# Patient Record
Sex: Male | Born: 1945 | Hispanic: Yes | Marital: Married | Smoking: Never smoker
Health system: Southern US, Community
[De-identification: ages and names within clinical notes are randomized; demographics above are authoritative.]

## PROBLEM LIST (undated history)

## (undated) DIAGNOSIS — E079 Disorder of thyroid, unspecified: Secondary | ICD-10-CM

## (undated) DIAGNOSIS — J45909 Unspecified asthma, uncomplicated: Secondary | ICD-10-CM

## (undated) DIAGNOSIS — R7303 Prediabetes: Secondary | ICD-10-CM

## (undated) DIAGNOSIS — I10 Essential (primary) hypertension: Secondary | ICD-10-CM

## (undated) DIAGNOSIS — E291 Testicular hypofunction: Secondary | ICD-10-CM

## (undated) DIAGNOSIS — M1990 Unspecified osteoarthritis, unspecified site: Secondary | ICD-10-CM

## (undated) DIAGNOSIS — R0600 Dyspnea, unspecified: Secondary | ICD-10-CM

## (undated) DIAGNOSIS — T7840XA Allergy, unspecified, initial encounter: Secondary | ICD-10-CM

## (undated) DIAGNOSIS — I8390 Asymptomatic varicose veins of unspecified lower extremity: Secondary | ICD-10-CM

## (undated) HISTORY — DX: Asymptomatic varicose veins of unspecified lower extremity: I83.90

## (undated) HISTORY — DX: Unspecified osteoarthritis, unspecified site: M19.90

## (undated) HISTORY — DX: Disorder of thyroid, unspecified: E07.9

## (undated) HISTORY — DX: Prediabetes: R73.03

## (undated) HISTORY — DX: Testicular hypofunction: E29.1

## (undated) HISTORY — DX: Allergy, unspecified, initial encounter: T78.40XA

## (undated) HISTORY — DX: Unspecified asthma, uncomplicated: J45.909

## (undated) HISTORY — DX: Essential (primary) hypertension: I10

## (undated) HISTORY — DX: Dyspnea, unspecified: R06.00

## (undated) HISTORY — PX: VARICOSE VEIN SURGERY: SHX832

---

## 2007-07-15 ENCOUNTER — Encounter: Payer: Self-pay | Admitting: Internal Medicine

## 2007-07-21 ENCOUNTER — Ambulatory Visit: Payer: Self-pay | Admitting: Internal Medicine

## 2007-07-21 DIAGNOSIS — E039 Hypothyroidism, unspecified: Secondary | ICD-10-CM

## 2007-07-21 DIAGNOSIS — N509 Disorder of male genital organs, unspecified: Secondary | ICD-10-CM | POA: Insufficient documentation

## 2007-07-21 DIAGNOSIS — E291 Testicular hypofunction: Secondary | ICD-10-CM

## 2007-07-21 DIAGNOSIS — E785 Hyperlipidemia, unspecified: Secondary | ICD-10-CM | POA: Insufficient documentation

## 2007-07-21 DIAGNOSIS — D126 Benign neoplasm of colon, unspecified: Secondary | ICD-10-CM

## 2007-07-21 DIAGNOSIS — I861 Scrotal varices: Secondary | ICD-10-CM

## 2007-07-21 LAB — CONVERTED CEMR LAB
Bilirubin Urine: NEGATIVE
Ketones, urine, test strip: NEGATIVE
Nitrite: NEGATIVE

## 2007-08-02 ENCOUNTER — Encounter: Admission: RE | Admit: 2007-08-02 | Discharge: 2007-08-02 | Payer: Self-pay | Admitting: Internal Medicine

## 2007-11-14 ENCOUNTER — Ambulatory Visit: Payer: Self-pay | Admitting: Internal Medicine

## 2007-11-14 DIAGNOSIS — J45909 Unspecified asthma, uncomplicated: Secondary | ICD-10-CM | POA: Insufficient documentation

## 2007-11-14 DIAGNOSIS — M159 Polyosteoarthritis, unspecified: Secondary | ICD-10-CM | POA: Insufficient documentation

## 2007-11-14 DIAGNOSIS — M1990 Unspecified osteoarthritis, unspecified site: Secondary | ICD-10-CM

## 2007-11-23 ENCOUNTER — Telehealth: Payer: Self-pay | Admitting: Internal Medicine

## 2007-11-23 LAB — CONVERTED CEMR LAB
ALT: 36 units/L (ref 0–53)
BUN: 16 mg/dL (ref 6–23)
Basophils Relative: 0.6 % (ref 0.0–1.0)
CO2: 30 meq/L (ref 19–32)
Calcium: 9.3 mg/dL (ref 8.4–10.5)
Chloride: 105 meq/L (ref 96–112)
Cholesterol: 200 mg/dL (ref 0–200)
Creatinine, Ser: 0.7 mg/dL (ref 0.4–1.5)
Eosinophils Relative: 14.8 % — ABNORMAL HIGH (ref 0.0–5.0)
GFR calc non Af Amer: 122 mL/min
Glucose, Bld: 98 mg/dL (ref 70–99)
HCT: 45.4 % (ref 39.0–52.0)
Hemoglobin: 15.2 g/dL (ref 13.0–17.0)
Lymphocytes Relative: 51.3 % — ABNORMAL HIGH (ref 12.0–46.0)
MCV: 91.7 fL (ref 78.0–100.0)
Monocytes Absolute: 0.5 10*3/uL (ref 0.1–1.0)
Neutro Abs: 0.8 10*3/uL — ABNORMAL LOW (ref 1.4–7.7)
PSA: 2.32 ng/mL (ref 0.10–4.00)
Rhuematoid fact SerPl-aCnc: <20 intl units/mL — ABNORMAL LOW (ref 0.0–20.0)
Sed Rate: 5 mm/hr (ref 0–16)
Testosterone: 188.28 ng/dL — ABNORMAL LOW (ref 350.00–890)

## 2007-11-24 ENCOUNTER — Telehealth (INDEPENDENT_AMBULATORY_CARE_PROVIDER_SITE_OTHER): Payer: Self-pay | Admitting: *Deleted

## 2007-12-26 ENCOUNTER — Telehealth (INDEPENDENT_AMBULATORY_CARE_PROVIDER_SITE_OTHER): Payer: Self-pay | Admitting: *Deleted

## 2008-09-09 IMAGING — US US SCROTUM
1 series · 13 of 25 positions shown · non-contrast
Comparison: none

CLINICAL DATA: Pain.  
 SCROTAL ULTRASOUND:
 DOPPLER ULTRASOUND OF THE TESTICLES:
TECHNIQUE: Complete ultrasound examination of the testicles, epididymis, and other scrotal structures was performed.  Color and spectral Doppler ultrasound were also utilized to evaluate blood flow to the testicles.

[Series 1: us scrotum · 0.09mm/px · 13 of 71 slices shown]
[im 1/71]
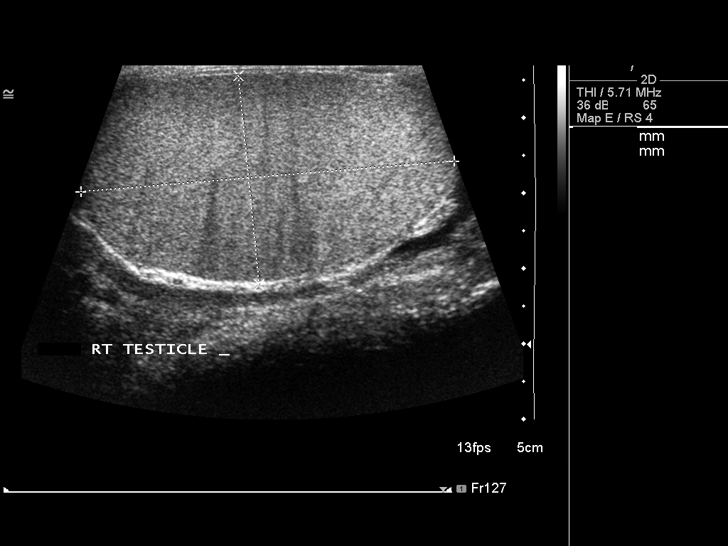
[im 6/71]
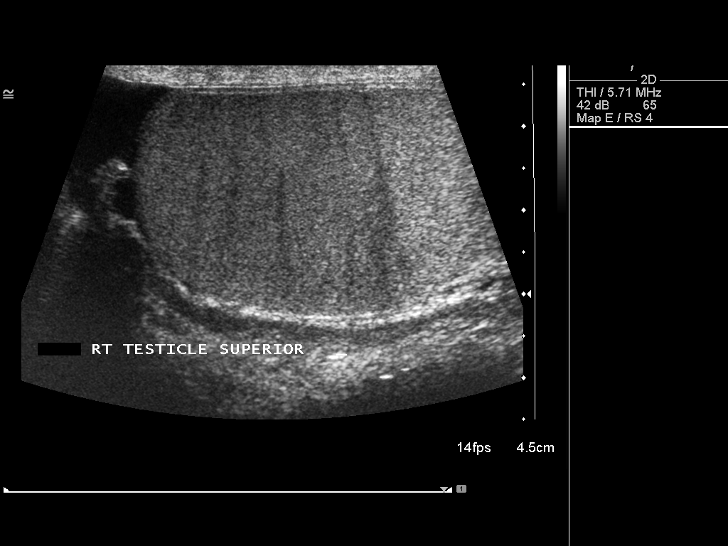
[im 12/71]
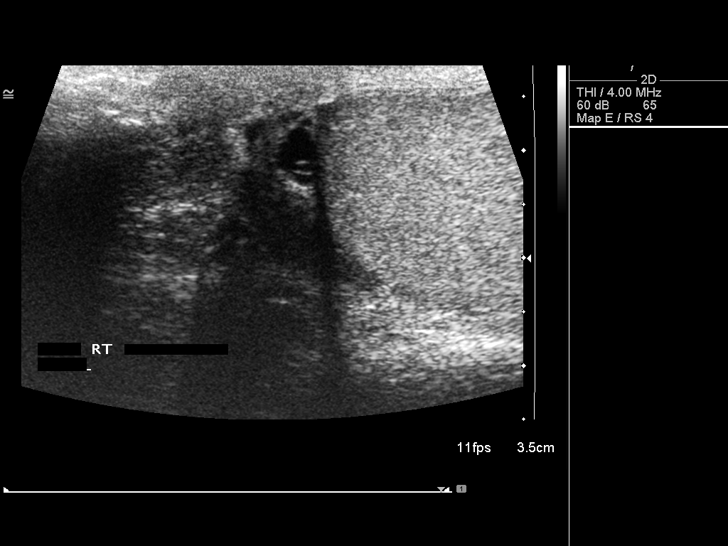
[im 18/71]
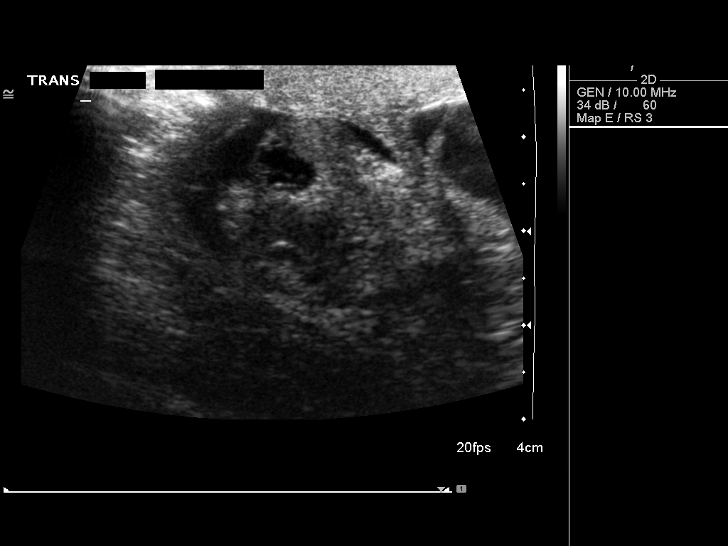
[im 24/71]
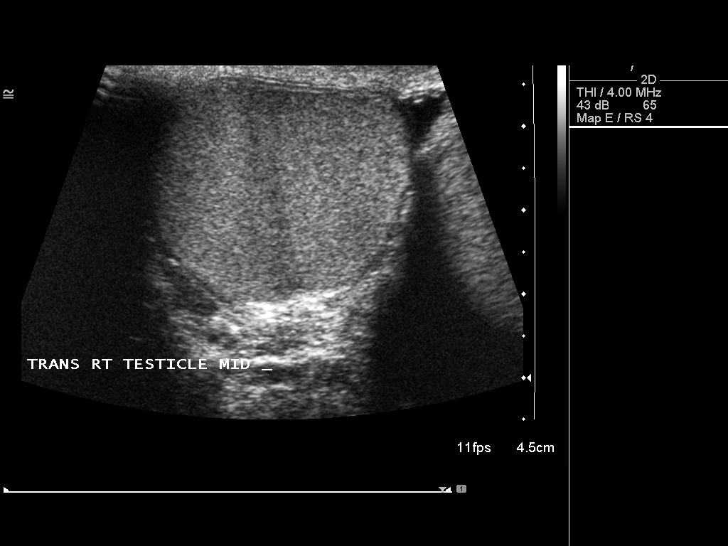
[im 30/71]
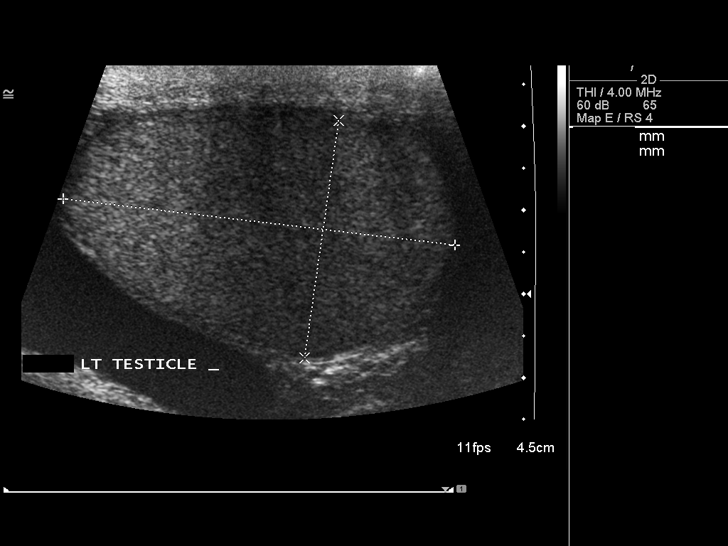
[im 36/71]
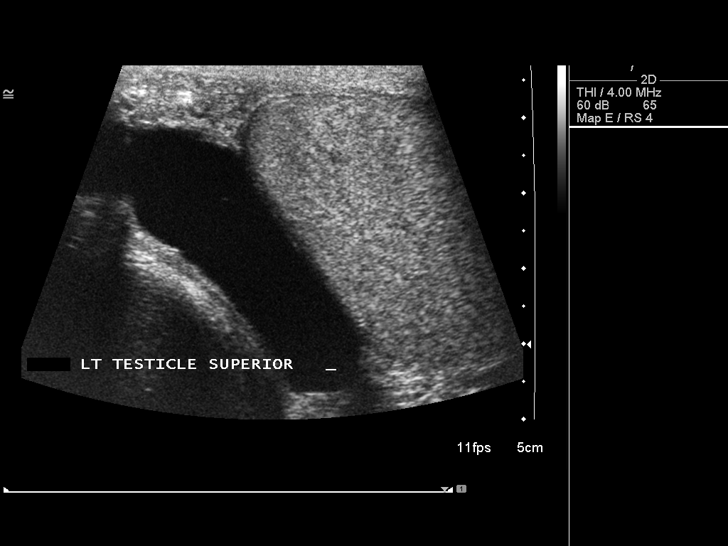
[im 41/71]
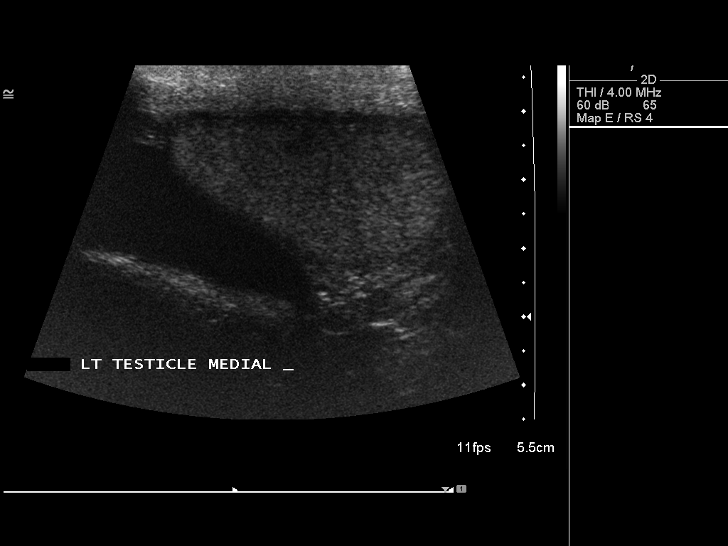
[im 47/71]
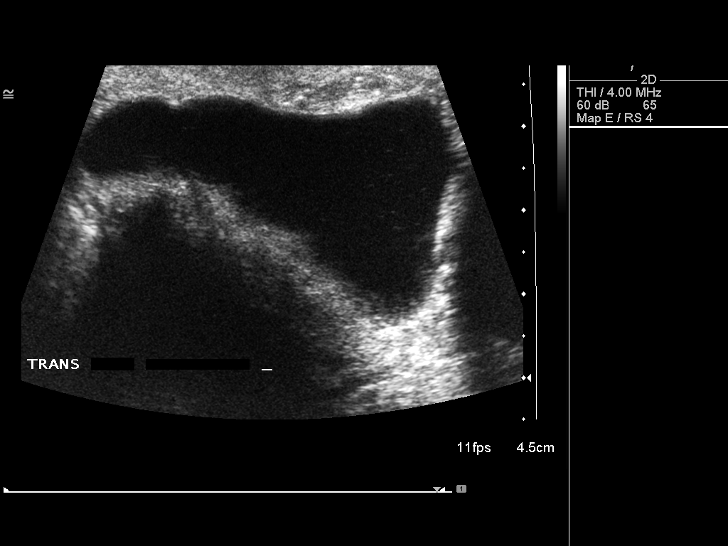
[im 53/71]
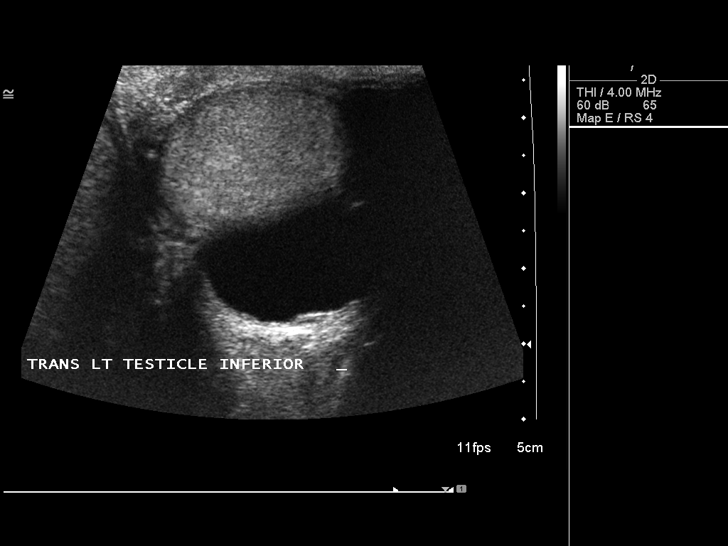
[im 59/71]
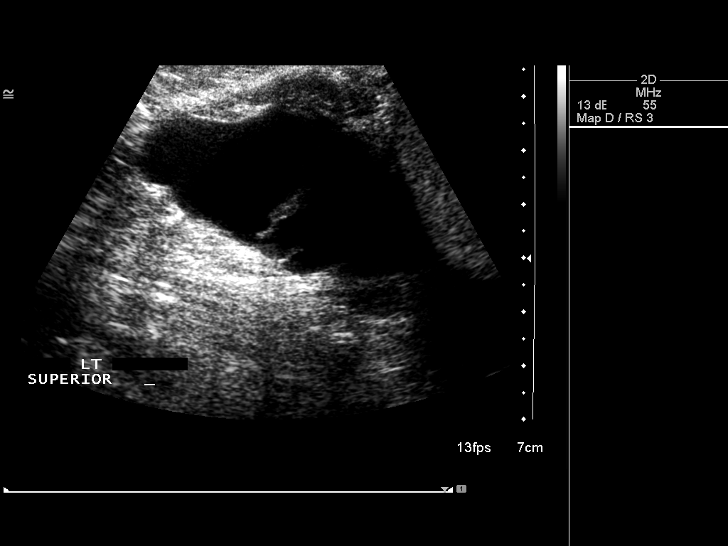
[im 65/71]
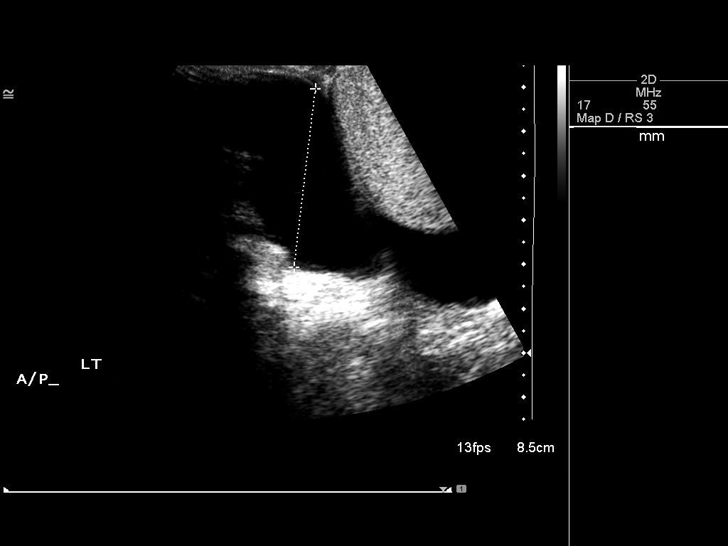
[im 71/71]
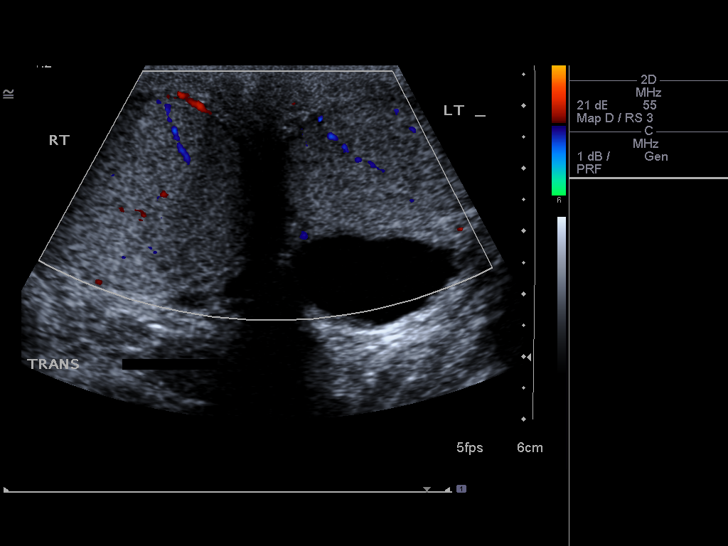

[13 of 25 positions shown; findings below may reference images not displayed]

FINDINGS: The right testicle is 5.0 x 2.8 x 3.1 cm and the left testicle is 4.7 x 2.9 x 3.6 cm.  Both testicles are homogeneous in echotexture, without evidence of focal mass.  Color Doppler flow to the testicles is symmetric.  Arterial and venous wave forms are identified within both testicles, without evidence of torsion.  
 There is a large right hydrocele that contains several echogenic bands coursing through it, suggesting mild complexity of the hydrocele.  There is a very small right hydrocele.  A 6 mm cyst is identified in the right epididymis, likely spermatocele or epididymal cyst.  The left epididymis is somewhat difficult to visualize due to the large hydrocele but does not appear to be enlarged.
IMPRESSION: 1.  Large mildly complex left hydrocele.  The presence of the septations in this hydrocele suggests that it is likely chronic. 
 2.  Normal appearance of the testicles.

## 2010-03-04 ENCOUNTER — Encounter: Admission: RE | Admit: 2010-03-04 | Discharge: 2010-03-04 | Payer: Self-pay | Admitting: Specialist

## 2011-10-05 ENCOUNTER — Other Ambulatory Visit: Payer: Self-pay

## 2011-10-05 DIAGNOSIS — I83893 Varicose veins of bilateral lower extremities with other complications: Secondary | ICD-10-CM

## 2011-11-15 ENCOUNTER — Encounter: Payer: Self-pay | Admitting: Surgery

## 2011-11-19 ENCOUNTER — Encounter: Payer: Self-pay | Admitting: Surgery

## 2011-11-22 ENCOUNTER — Encounter: Payer: Self-pay | Admitting: Surgery

## 2011-12-16 ENCOUNTER — Encounter: Payer: Self-pay | Admitting: Vascular Surgery

## 2011-12-17 ENCOUNTER — Encounter: Payer: Self-pay | Admitting: Vascular Surgery

## 2012-02-04 ENCOUNTER — Encounter: Payer: Self-pay | Admitting: Surgery

## 2012-02-07 ENCOUNTER — Encounter (INDEPENDENT_AMBULATORY_CARE_PROVIDER_SITE_OTHER): Payer: BC Managed Care – PPO | Admitting: *Deleted

## 2012-02-07 ENCOUNTER — Encounter: Payer: Self-pay | Admitting: Surgery

## 2012-02-07 ENCOUNTER — Ambulatory Visit (INDEPENDENT_AMBULATORY_CARE_PROVIDER_SITE_OTHER): Payer: BC Managed Care – PPO | Admitting: Surgery

## 2012-02-07 VITALS — BP 136/75 | HR 73 | Temp 98.2°F | Ht 65.0 in | Wt 184.0 lb

## 2012-02-07 DIAGNOSIS — I8310 Varicose veins of unspecified lower extremity with inflammation: Secondary | ICD-10-CM

## 2012-02-07 DIAGNOSIS — I83893 Varicose veins of bilateral lower extremities with other complications: Secondary | ICD-10-CM

## 2012-02-07 NOTE — Progress Notes (Signed)
[facility]   Patient name: Clarence Sanders MRN: [id number] DOB: 1946/03/24 Sex: male   Referred by: Self  Reason for referral:  Chief Complaint  Patient presents with  . Varicose Veins    self referred - pt c/o of pain in right leg    HISTORY OF PRESENT ILLNESS: The patient comes in today for further evaluation of right lower extremity varicosities. He states that approximately 25 years ago he underwent a vein operation in [place] to treat varicosities on his left leg. He has had no issues since his operation. Approximately 8 years ago he had surgery on the right leg for similar problems however he has had persistent issues in his right leg. He continues to have significant pain in the varicose veins particularly at the end of a long day were he has been on his feet for a long time. He does not complain of a significant amount of swelling although he will get swelling at the end of the day which is improved by wearing knee-high compression stockings.  Past Medical History  Diagnosis Date  . Hypertension   . Varicose veins   . Thyroid disease     Hypothyroidism  . Hypogonadism male     Past Surgical History  Procedure Date  . Varicose vein surgery     removed vein in left leg    History   Social History  . Marital Status: Married    Spouse Name: N/A    Number of Children: N/A  . Years of Education: N/A   Occupational History  . Not on file.   Social History Main Topics  . Smoking status: Never Smoker   . Smokeless tobacco: Never Used  . Alcohol Use: 5.4 oz/week    5 Cans of beer, 4 Shots of liquor per week  . Drug Use: No  . Sexually Active: Not on file   Other Topics Concern  . Not on file   Social History Narrative  . No narrative on file    Family History  Problem Relation Age of Onset  . Other Mother     varicose veins  . Heart disease Father     before age 71  . Heart attack Father   . Cancer Sister   . Cancer Brother       Allergies as of 02/07/2012 - Review Complete 02/07/2012  Allergen Reaction Noted  . Aspirin  07/21/2007  . Ketorolac tromethamine  07/21/2007  . Penicillins  07/21/2007    Current Outpatient Prescriptions on File Prior to Visit  Medication Sig Dispense Refill  . AZOR 5-40 MG per tablet Take 5 tablets by mouth daily.      . Thyroid, Pork, 60 MG CAPS Take 60 mg by mouth daily.         REVIEW OF SYSTEMS: Positive for pain in the varicose veins all other systems are negative as documented by the patient and the encounter form.  PHYSICAL EXAMINATION: General: The patient appears their stated age.  Vital signs are BP 136/75  Pulse 73  Temp 98.2 F (36.8 C) (Oral)  Ht 5\' 5"  (1.651 m)  Wt 184 lb (83.462 kg)  BMI 30.62 kg/m2 HEENT:  No gross abnormalities Pulmonary: Respirations are non-labored Musculoskeletal: There are no major deformities.   Neurologic: No focal weakness or paresthesias are detected, Skin: There are no ulcer or rashes noted. Psychiatric: The patient has normal affect. Cardiovascular: There is a regular rate and rhythm without significant murmur appreciated.  No carotid bruit. Palpable pedal pulses. Prominent varicosities on the right lateral leg and upper thigh. Trace edema  Diagnostic Studies: Duplex ultrasound was ordered and reviewed. The proximal right saphenous vein has previously been stripped. There is residual saphenous vein present which is positive for reflux. He also has severe reflux in the deep system. The saphenous vein diameter is 0.6 to a 0.4    Assessment:  Right leg reflux with varicosities Plan: The patient has both superficial and deep system reflux in his right leg which are contributing to his symptoms. In addition he has a very prominent varicosity along the anterior and lateral side of his leg. This does become very painful for him with prolonged standing. I believe he would benefit from ablation of the residual greater saphenous vein  which continues to have reflux as well as phlebectomy of his prominent varicosity. He does have deep system reflux over this may not completely alleviate his problems but I do believe it will improve his symptoms. I have placed him in thigh high 20-30 mm compression stockings to be worn beginning today. He will followup with Dr. Hart Rochester in 3 months to discuss possible intervention. He is Spanish speaking and an interpreter was present with him today.     Jorge Ny, M.D. [facility] [place] Office: [phone] Pager:  [phone]

## 2012-02-14 NOTE — Procedures (Unsigned)
LOWER EXTREMITY VENOUS REFLUX EXAM  INDICATION:  Right lower extremity varicose veins with pain and swelling.  Previous right great saphenous vein stripping.  EXAM:  Using color-flow imaging and pulse Doppler spectral analysis, the right common femoral, femoral, popliteal, posterior tibial, great and small saphenous veins were evaluated.  There is evidence suggesting deep venous insufficiency in the right lower extremity.  The right saphenofemoral junction and proximal great saphenous vein were not visualized due to previous stripping.  In the mid thigh the great saphenous vein reconstitutes and is not competent with reflux of >500 milliseconds.  The right anterior saphenous vein is not competent with reflux of >500 milliseconds.  Diameter measurements range from 0.73 cm to 0.49 cm.  The right proximal small saphenous vein demonstrates competency.  GSV Diameter (used if found to be incompetent only)                                           Right    Left Proximal Greater Saphenous Vein           NV cm    cm Proximal-to-mid-thigh                     NV cm    cm Mid thigh                                 0.47 cm  cm Mid-distal thigh                          cm       cm Distal thigh                              0.64 cm  cm Knee                                      0.52 cm  cm  IMPRESSION: 1. Previous stripping of right great saphenous vein, however, there is     reconstitution in the mid thigh and the remainder of the vein is     incompetent with reflux of >500 milliseconds. 2. The right anterior saphenous vein is not competent with reflux of     >500 milliseconds. 3. The deep venous system is not competent with reflux of >500     milliseconds. 4. The small saphenous vein is competent.       ___________________________________________ V. Charlena Cross, MD  LT/MEDQ  D:  02/08/2012  T:  02/08/2012  Job:  [id number]

## 2012-05-08 ENCOUNTER — Encounter: Payer: Self-pay | Admitting: Vascular Surgery

## 2012-05-09 ENCOUNTER — Ambulatory Visit (INDEPENDENT_AMBULATORY_CARE_PROVIDER_SITE_OTHER): Payer: Medicare Other | Admitting: Vascular Surgery

## 2012-05-09 ENCOUNTER — Encounter: Payer: Self-pay | Admitting: Vascular Surgery

## 2012-05-09 VITALS — BP 119/73 | HR 69 | Resp 14 | Ht 62.0 in | Wt 176.9 lb

## 2012-05-09 DIAGNOSIS — I83893 Varicose veins of bilateral lower extremities with other complications: Secondary | ICD-10-CM

## 2012-05-09 NOTE — Progress Notes (Addendum)
Subjective:     Patient ID: Clarence Sanders, male   DOB: [birth year], 66 y.o.   MRN: [id number]  HPI this 66 year old male returns for further discussion regarding her severe venous insufficiency of the right leg. He is Hispanic and there is an interpreter present. He has been wearing long leg elastic compression stockings and trying elevation and ibuprofen for the past 3 months without success. He continues to have aching throbbing and burning discomfort beginning in the anterior thigh extending into the lateral calf and medial calf areas. This worsens as the day progresses. He has no history of DVT or thrombophlebitis or stasis ulcers. He had a partial stripping of the great saphenous vein proximally performed 8 years ago in [place]. He also had remote stripping of the left great saphenous vein 25 years ago. He developed recurrence of the right varicosities very rapidly.  Past Medical History  Diagnosis Date  . Hypertension   . Varicose veins   . Thyroid disease     Hypothyroidism  . Hypogonadism male     History  Substance Use Topics  . Smoking status: Never Smoker   . Smokeless tobacco: Never Used  . Alcohol Use: 5.4 oz/week    5 Cans of beer, 4 Shots of liquor per week    Family History  Problem Relation Age of Onset  . Other Mother     varicose veins  . Heart disease Father     before age 42  . Heart attack Father   . Cancer Sister   . Cancer Brother     Allergies  Allergen Reactions  . Aspirin Anaphylaxis    Angioedema,   . Penicillins Anaphylaxis  . Ketorolac Tromethamine     Current outpatient prescriptions:AZOR 5-40 MG per tablet, Take 1 tablet by mouth daily. , Disp: , Rfl: ;  Thyroid, Pork, 60 MG CAPS, Take 60 mg by mouth daily., Disp: , Rfl:   BP 119/73  Pulse 69  Resp 14  Ht 5\' 2"  (1.575 m)  Wt 176 lb 14.4 oz (80.241 kg)  BMI 32.36 kg/m2  SpO2 98%  Body mass index is 32.36 kg/(m^2).         Review of Systems denies chest pain, dyspnea on exertion,  PND, orthopnea     Objective:   Physical Exam blood pressure 119/73 heart rate 69 respirations 14 General well-developed well-nourished male in no apparent stress alert and oriented x3 Lungs no rhonchi or wheezing Right lower extremity with 3+ femoral and dorsalis pedis pulse palpable. There bulging varicosities beginning in the proximal third of the right thigh extending anteriorly and lateral to the knee. There is a second large cluster of varicosities beginning in the distal thigh medially extending into the medial calf. There is 1+ edema distally. No evidence of ischemia or ulceration.  Duplex scan performed at his last visit 02/07/2012 reveals gross reflux in the anterior accessory branch of the right great saphenous vein supplying the anterior varicosities. There is also gross reflux in the right great saphenous vein extending from the knee to the proximal thigh where the vein was not removed. There is no DVT.     Assessment:     Severe venous insufficiency right leg-recurrent with gross reflux #1 anterior accessory branch right great saphenous vein and #2 residual main trunk right great saphenous vein from knee to proximal thigh with bulging symptomatic varicosities-affecting patient's daily living and resistant to medical management     Plan:     Patient needs a laser  ablation of both anterior accessory branch and residual main trunk right great saphenous vein is as single procedure. He will then return in 3 months to see if stab phlebectomy of secondary varicosities will be indicated. Will proceed with precertification to perform this in the near future.     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 He states his symptoms are much worse while he is standing and it is making it difficult for him to complete his work shift. He also travels significant times in an automobile  to and from work and the pain also is worsened by  sitting for long periods. For these reasons his condition is making it difficult for him to carryout his daily activities.

## 2012-08-11 ENCOUNTER — Encounter: Payer: Self-pay | Admitting: Vascular Surgery

## 2012-08-14 ENCOUNTER — Ambulatory Visit (INDEPENDENT_AMBULATORY_CARE_PROVIDER_SITE_OTHER): Payer: BC Managed Care – PPO | Admitting: Vascular Surgery

## 2012-08-14 ENCOUNTER — Encounter: Payer: Self-pay | Admitting: Vascular Surgery

## 2012-08-14 VITALS — BP 131/82 | HR 91 | Resp 16 | Ht 62.0 in | Wt 176.0 lb

## 2012-08-14 DIAGNOSIS — I83893 Varicose veins of bilateral lower extremities with other complications: Secondary | ICD-10-CM

## 2012-08-14 NOTE — Progress Notes (Signed)
Laser Ablation Procedure      Date: 08/14/2012    Clarence Sanders DOB:April 17, 1946  Consent signed: Yes  Surgeon:J.D. Hart Rochester  Procedure: Laser Ablation: right Greater Saphenous Vein  BP 131/82  Pulse 91  Resp 16  Ht 5\' 2"  (1.575 m)  Wt 176 lb (79.833 kg)  BMI 32.19 kg/m2  Start time: 9:10   End time: 9:55  Tumescent Anesthesia: 210 cc 0.9% NaCl with 50 cc Lidocaine HCL with 1% Epi and 15 cc 8.4% NaHCO3  Local Anesthesia: 5 cc Lidocaine HCL and NaHCO3 (ratio 2:1)  Pulsed mode: Watts 15 Seconds 1 Pulses:1 Total Pulses:71 Total Energy: 1065 Total Time: 1:11    Patient tolerated procedure well: Yes  Notes:   Description of Procedure:  After marking the course of the saphenous vein and the secondary varicosities in the standing position, the patient was placed on the operating table in the supine position, and the right leg was prepped and draped in sterile fashion. Local anesthetic was administered, and under ultrasound guidance the saphenous vein was accessed with a micro needle and guide wire; then the micro puncture sheath was placed. A guide wire was inserted to the saphenofemoral junction, followed by a 5 french sheath.  The position of the sheath and then the laser fiber below the junction was confirmed using the ultrasound and visualization of the aiming beam.  Tumescent anesthesia was administered along the course of the saphenous vein using ultrasound guidance. Protective laser glasses were placed on the patient, and the laser was fired at 15 watt pulsed mode advancing 1-2 mm per sec.  For a total of 1065 joules.  A steri strip was applied to the puncture site.    ABD pads and thigh high compression stockings were applied.  Ace wrap bandages were applied over the phlebectomy sites and at the top of the saphenofemoral junction.  Blood loss was less than 15 cc.  The patient ambulated out of the operating room having tolerated the procedure well.

## 2012-08-14 NOTE — Progress Notes (Signed)
Subjective:     Patient ID: Clarence Sanders, male   DOB: [birth year], 67 y.o.   MRN: [id number]  HPI this 67 year old Hispanic male had laser ablation of the right great saphenous vein from the proximal calf to the proximal third of the thigh for venous hypertension with recurrent varicosities. This was done under local tumescent anesthesia and a total of 1065 J of energy was utilized. Patient had previously had stripping of the very proximal portion of the GSV  many years ago. He has gross reflux in the anterior accessory branch as well supplying recurrent varicosities.   Review of Systems     Objective:   Physical ExamBP 131/82  Pulse 91  Resp 16  Ht 5\' 2"  (1.575 m)  Wt 176 lb (79.833 kg)  BMI 32.19 kg/m2       Assessment:    well-tolerated laser ablation right great saphenous vein from the proximal calf to proximal third of thigh-performed under local tumescent anesthesia    Plan:     Return Tuesday, January 14 for venous duplex exam to confirm closure right GSV. Patient will then return in 3 months for further followup. Patient will ultimately require laser ablation anterior accessory branch right GSC plus stab phlebectomy

## 2012-08-15 ENCOUNTER — Encounter: Payer: Self-pay | Admitting: Vascular Surgery

## 2012-08-18 ENCOUNTER — Other Ambulatory Visit: Payer: Self-pay | Admitting: *Deleted

## 2012-08-18 DIAGNOSIS — I83893 Varicose veins of bilateral lower extremities with other complications: Secondary | ICD-10-CM

## 2012-08-21 ENCOUNTER — Encounter: Payer: Self-pay | Admitting: Vascular Surgery

## 2012-08-22 ENCOUNTER — Encounter (INDEPENDENT_AMBULATORY_CARE_PROVIDER_SITE_OTHER): Payer: Medicare Other | Admitting: *Deleted

## 2012-08-22 ENCOUNTER — Ambulatory Visit (INDEPENDENT_AMBULATORY_CARE_PROVIDER_SITE_OTHER): Payer: Medicare Other | Admitting: Vascular Surgery

## 2012-08-22 ENCOUNTER — Encounter: Payer: Self-pay | Admitting: Vascular Surgery

## 2012-08-22 VITALS — BP 112/70 | HR 73 | Resp 18 | Ht 63.0 in | Wt 180.0 lb

## 2012-08-22 DIAGNOSIS — Z48812 Encounter for surgical aftercare following surgery on the circulatory system: Secondary | ICD-10-CM

## 2012-08-22 DIAGNOSIS — I83893 Varicose veins of bilateral lower extremities with other complications: Secondary | ICD-10-CM

## 2012-08-22 NOTE — Progress Notes (Signed)
Subjective:     Patient ID: Clarence Sanders, male   DOB: Jun 07, 1946, 67 y.o.   MRN: [id number]  HPI this 67 year old male is one week post laser ablation right great saphenous vein from mid calf to proximal thigh for bulging varicosities and venous hypertension with tiny. He tolerated the procedure well last week. He has had mild to moderate discomfort along the course of the great saphenous vein. The most proximal portion of the great saphenous vein had been stripped in the past but there was severe reflux in the residual segment. Patient has been wearing his long-leg elastic compression stocking and taking ibuprofen as prescribed.  Past Medical History  Diagnosis Date  . Hypertension   . Varicose veins   . Thyroid disease     Hypothyroidism  . Hypogonadism male     History  Substance Use Topics  . Smoking status: Never Smoker   . Smokeless tobacco: Never Used  . Alcohol Use: 5.4 oz/week    5 Cans of beer, 4 Shots of liquor per week    Family History  Problem Relation Age of Onset  . Other Mother     varicose veins  . Heart disease Father     before age 67  . Heart attack Father   . Cancer Sister   . Cancer Brother     Allergies  Allergen Reactions  . Aspirin Anaphylaxis    Angioedema,   . Penicillins Anaphylaxis  . Ketorolac Tromethamine     Current outpatient prescriptions:AZOR 5-40 MG per tablet, Take 1 tablet by mouth daily. , Disp: , Rfl: ;  Thyroid, Pork, 60 MG CAPS, Take 60 mg by mouth daily., Disp: , Rfl:   BP 112/70  Pulse 73  Resp 18  Ht 5\' 3"  (1.6 m)  Wt 180 lb (81.647 kg)  BMI 31.89 kg/m2  Body mass index is 31.89 kg/(m^2).           Review of Systems denies chest pain, dyspnea on exertion, PND, orthopnea, hemoptysis, claudication to     Objective:   Physical Exam blood pressure 112/70 heart rate 73 respirations 18 General well-developed well-nourished male in no apparent distress alert and oriented x3 Lungs no rhonchi or wheezing Right leg  with mild-to-moderate tenderness along course of great saphenous vein from mid calf to proximal thigh. There are residual bulging varicosities in the anterior thigh from the anterior accessory branch of the great saphenous vein which has known gross reflux area there are also bulging varicosities below the knee medially and laterally.  Today I ordered a venous duplex exam of the right leg which are reviewed and interpreted. The great saphenous vein is totally closed with no DVT.    Assessment:     Severe venous insufficiency right leg with successful closure main trunk right great saphenous vein causing painful varicosities #2 patent large anterior accessory branch right great saphenous vein supplying bulging varicosities anterior thigh and below the knee with gross reflux    Plan:     Continue long-leg elastic compression stockings 20-30 mm gradient #2 return in 3 months. We'll reassess then to see if he needs a laser ablation anterior accessory branch left great saphenous vein possibly to be followed by stab phlebectomy of painful varicosities

## 2012-08-28 ENCOUNTER — Ambulatory Visit: Payer: Medicare Other | Admitting: Vascular Surgery

## 2012-11-20 ENCOUNTER — Encounter: Payer: Self-pay | Admitting: Vascular Surgery

## 2012-11-21 ENCOUNTER — Encounter: Payer: Self-pay | Admitting: Vascular Surgery

## 2012-11-21 ENCOUNTER — Ambulatory Visit (INDEPENDENT_AMBULATORY_CARE_PROVIDER_SITE_OTHER): Payer: BC Managed Care – PPO | Admitting: Vascular Surgery

## 2012-11-21 VITALS — BP 124/79 | HR 73 | Resp 16 | Ht 64.0 in | Wt 180.0 lb

## 2012-11-21 DIAGNOSIS — I83893 Varicose veins of bilateral lower extremities with other complications: Secondary | ICD-10-CM

## 2012-11-21 NOTE — Progress Notes (Signed)
Subjective:     Patient ID: Clarence Sanders, male   DOB: September 15, 1945, 67 y.o.   MRN: [id number]  HPI this 67 year old male returns for continued followup regarding his painful varicose veins in the right anterior lateral thigh. He previously underwent laser ablation of the right great saphenous vein 3 months ago. At that time it was known that he also had severe reflux in the anterior accessory branch supplying bulging varicosities in the anterior thigh. He was only approved for laser ablation of the right great saphenous vein but not the anterior accessory branch. Since his procedure he has continued to have aching throbbing and painful varicosities in the anterior thigh. This is despite wearing long leg elastic compression stockings 20-30 mm gradient and trying elevation and ibuprofen. He also has 1+ edema distally in the ankle. His symptoms worsen as the day progresses and more he is on his feet.  Past Medical History  Diagnosis Date  . Hypertension   . Varicose veins   . Thyroid disease     Hypothyroidism  . Hypogonadism male     History  Substance Use Topics  . Smoking status: Never Smoker   . Smokeless tobacco: Never Used  . Alcohol Use: 5.4 oz/week    5 Cans of beer, 4 Shots of liquor per week    Family History  Problem Relation Age of Onset  . Other Mother     varicose veins  . Heart disease Father     before age 17  . Heart attack Father   . Cancer Sister   . Cancer Brother     Allergies  Allergen Reactions  . Aspirin Anaphylaxis    Angioedema,   . Penicillins Anaphylaxis  . Ketorolac Tromethamine     Current outpatient prescriptions:AZOR 5-40 MG per tablet, Take 1 tablet by mouth daily. , Disp: , Rfl: ;  Thyroid, Pork, 60 MG CAPS, Take 60 mg by mouth daily., Disp: , Rfl:   BP 124/79  Pulse 73  Resp 16  Ht 5\' 4"  (1.626 m)  Wt 180 lb (81.647 kg)  BMI 30.88 kg/m2  Body mass index is 30.88 kg/(m^2).           Review of Systems denies chest pain, dyspnea on  exertion, PND, orthopnea, hemoptysis     Objective:   Physical Exam blood pressure 124/79 heart rate 73 respirations 16 Lungs no rhonchi or wheezing Right lower extremity with extensive network of bulging varicosities beginning in the anterior thigh of the middle third extending laterally around the knee and into the calf area. These are in communication with the anterior accessory branch of the right great saphenous vein. 1+ edema distally. 3+ posterior tibial pulse palpable.     Assessment:     Severe venous insufficiency right leg status post laser ablation right great saphenous vein but with residual gross reflux in large anterior accessory branch right great saphenous vein which supplies these symptomatic bulging varicosities-affecting patient's daily living and resistant to conservative measures    Plan:     Patient needs laser ablation anterior accessory branch right great saphenous vein plus greater than 20 stab phlebectomy as single procedure Will proceed with precertification to perform this in the near future

## 2012-12-11 ENCOUNTER — Other Ambulatory Visit: Payer: Self-pay | Admitting: *Deleted

## 2012-12-11 DIAGNOSIS — I83893 Varicose veins of bilateral lower extremities with other complications: Secondary | ICD-10-CM

## 2012-12-18 ENCOUNTER — Other Ambulatory Visit: Payer: Medicare Other | Admitting: Vascular Surgery

## 2012-12-25 ENCOUNTER — Other Ambulatory Visit: Payer: Medicare Other | Admitting: Vascular Surgery

## 2013-01-02 ENCOUNTER — Ambulatory Visit: Payer: Medicare Other | Admitting: Vascular Surgery

## 2013-01-09 ENCOUNTER — Other Ambulatory Visit: Payer: Self-pay | Admitting: *Deleted

## 2013-01-09 DIAGNOSIS — I83893 Varicose veins of bilateral lower extremities with other complications: Secondary | ICD-10-CM

## 2013-03-02 ENCOUNTER — Encounter: Payer: Self-pay | Admitting: Vascular Surgery

## 2013-03-05 ENCOUNTER — Other Ambulatory Visit: Payer: Medicare Other | Admitting: Vascular Surgery

## 2013-03-13 ENCOUNTER — Ambulatory Visit: Payer: Medicare Other | Admitting: Vascular Surgery

## 2015-08-12 DIAGNOSIS — I10 Essential (primary) hypertension: Secondary | ICD-10-CM | POA: Insufficient documentation

## 2015-08-12 DIAGNOSIS — K219 Gastro-esophageal reflux disease without esophagitis: Secondary | ICD-10-CM | POA: Insufficient documentation

## 2016-03-04 DIAGNOSIS — J4520 Mild intermittent asthma, uncomplicated: Secondary | ICD-10-CM | POA: Insufficient documentation

## 2016-03-04 DIAGNOSIS — K2270 Barrett's esophagus without dysplasia: Secondary | ICD-10-CM | POA: Insufficient documentation

## 2016-09-07 ENCOUNTER — Ambulatory Visit (INDEPENDENT_AMBULATORY_CARE_PROVIDER_SITE_OTHER): Payer: Medicare Other

## 2016-09-07 ENCOUNTER — Ambulatory Visit (INDEPENDENT_AMBULATORY_CARE_PROVIDER_SITE_OTHER): Payer: Medicare Other | Admitting: Physician Assistant

## 2016-09-07 VITALS — BP 122/89 | HR 80 | Temp 97.7°F | Ht 64.0 in | Wt 183.6 lb

## 2016-09-07 DIAGNOSIS — M545 Low back pain, unspecified: Secondary | ICD-10-CM

## 2016-09-07 DIAGNOSIS — R6889 Other general symptoms and signs: Secondary | ICD-10-CM

## 2016-09-07 DIAGNOSIS — H578 Other specified disorders of eye and adnexa: Secondary | ICD-10-CM

## 2016-09-07 MED ORDER — CYCLOBENZAPRINE HCL 5 MG PO TABS: 5.0000 mg | ORAL_TABLET | Freq: Three times a day (TID) | ORAL | 0 refills | Status: DC | PRN

## 2016-09-07 MED ORDER — TRAMADOL HCL 50 MG PO TABS: 50.0000 mg | ORAL_TABLET | Freq: Three times a day (TID) | ORAL | 0 refills | Status: DC | PRN

## 2016-09-07 MED ORDER — OLOPATADINE HCL 0.1 % OP SOLN: 1.0000 [drp] | Freq: Two times a day (BID) | OPHTHALMIC | 0 refills | Status: DC

## 2016-09-07 NOTE — Patient Instructions (Addendum)
Ok to use tylenol 2 pills every 8 hours  Use the ultram if you have addition pain after the tylenol    IF you received an x-ray today, you will receive an invoice from [facility]. Please contact [facility] at [phone] with questions or concerns regarding your invoice.   IF you received labwork today, you will receive an invoice from [facility]. Please contact [facility] at [phone] with questions or concerns regarding your invoice.   Our billing staff will not be able to assist you with questions regarding bills from these companies.  You will be contacted with the lab results as soon as they are available. The fastest way to get your results is to activate your My Chart account. Instructions are located on the last page of this paperwork. If you have not heard from [place] regarding the results in 2 weeks, please contact this office.

## 2016-09-07 NOTE — Progress Notes (Signed)
Clarence Sanders  MRN: [id number] DOB: 05-21-1946  Subjective:  Pt presents to clinic with low back pain and right eye swelling both of which started last night.  Back pain - yesterday am he worked out at [employer] the same workout as normal - the pain is across the low back last night and it caused him to have trouble sleeping.  No pain radiation.  No problems with bladder or bowel control.  No paresthesia.  No change in pain after he took tylenol which helped the pain only slightly.  He has no h/o back injury or problems in the past.  Eye - last night he started swelling - he was rubbing his eye yesterday while watching tv and then it started to swell.  No trouble seeing or pain in the orbit.  The skin is some itchy also where it is swollen.  He has done nothing for this.  Review of Systems  Eyes: Positive for itching. Negative for pain, discharge, redness and visual disturbance.  Musculoskeletal: Positive for back pain. Negative for gait problem.    Patient Active Problem List   Diagnosis Date Noted  . Mild intermittent asthma without complication 123456  . Barrett's esophagus without dysplasia 03/04/2016  . Gastroesophageal reflux disease 08/12/2015  . Benign essential hypertension 08/12/2015  . Varicose veins of lower extremities with other complications 0000000  . ASTHMA 11/14/2007  . OSTEOARTHRITIS 11/14/2007  . COLONIC POLYPS 07/21/2007  . Hypothyroidism (acquired) 07/21/2007  . HYPOGONADISM, MALE 07/21/2007  . HYPERLIPIDEMIA 07/21/2007  . VARICOCELE 07/21/2007  . 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 Thyroid, Pork, 60 MG CAPS Take 60 mg by mouth daily.     No current facility-administered medications on file prior to visit.     Allergies  Allergen Reactions  . Aspirin Anaphylaxis    Angioedema,   . Penicillins Anaphylaxis  . Ketorolac Tromethamine      Pt patients past, family and social history were reviewed and updated.   Objective:  BP 122/89   Pulse 80   Temp 97.7 F (36.5 C) (Oral)   Ht 5\' 4"  (1.626 m)   Wt 183 lb 9.6 oz (83.3 kg)   SpO2 95%   BMI 31.51 kg/m    Visual Acuity Screening   Right eye Left eye Both eyes  Without correction: 20/40 20/30 20/40   With correction:        Physical Exam  Constitutional: He is oriented to person, place, and time and well-developed, well-nourished, and in no distress.  HENT:  Head: Normocephalic and atraumatic.  Right Ear: External ear normal.  Left Ear: External ear normal.  Eyes: Conjunctivae and EOM are normal. Pupils are equal, round, and reactive to light. Right eye exhibits no discharge.  Upper and lower eyelids - swollen without erythema  Neck: Normal range of motion.  Pulmonary/Chest: Effort normal.  Musculoskeletal:       Lumbar back: He exhibits decreased range of motion, tenderness, bony tenderness and spasm (bilateral paraspinal muscles).  Neurological: He is alert and oriented to person, place, and time. He has normal motor skills, normal sensation, normal strength and normal reflexes. He displays no weakness. Gait normal. Gait normal.  Skin: Skin is warm and dry.  Psychiatric: Mood, memory, affect and judgment normal.    Dg Lumbar Spine 2-3 Views  Result Date: 09/07/2016 CLINICAL DATA:  Low back pain without sciatica starting last  night, no known injury EXAM: LUMBAR SPINE - 2-3 VIEW COMPARISON:  None. FINDINGS: Five views of the lumbar spine submitted. No acute fracture or subluxation. There is mild levoscoliosis. Endplate sclerotic changes mild anterior spurring upper endplate of L2 vertebral body. Mild disc space flattening at L3-L4, L4 L5 and L5-S1 level. Mild facet degenerative changes at L5 level. IMPRESSION: No acute fracture or subluxation. Mild levoscoliosis. Mild degenerative changes as described above. Electronically Signed   By: Lahoma Crocker M.D.   On:  09/07/2016 10:35    Assessment and Plan :  Acute bilateral low back pain without sciatica - Plan: DG Lumbar Spine 2-3 Views, cyclobenzaprine (FLEXERIL) 5 MG tablet, traMADol (ULTRAM) 50 MG tablet - rest - ok to continue walking but no other working out for 3-4 days until his pain is controlled - warning signs of when to return to clinic d/w pt.  Itchy eyes - Plan: olopatadine (PATANOL) 0.1 % ophthalmic solution -  Ok to use zyrtec - try to stop rubbing eyes - ok to use cool compresses.  Windell Hummingbird PA-C  [facility] 09/07/2016 11:11 AM

## 2016-11-15 ENCOUNTER — Ambulatory Visit (INDEPENDENT_AMBULATORY_CARE_PROVIDER_SITE_OTHER): Payer: Medicare Other | Admitting: Emergency Medicine

## 2016-11-15 VITALS — BP 135/80 | HR 81 | Temp 97.8°F | Resp 18 | Ht 64.0 in | Wt 182.8 lb

## 2016-11-15 DIAGNOSIS — E079 Disorder of thyroid, unspecified: Secondary | ICD-10-CM

## 2016-11-15 DIAGNOSIS — K219 Gastro-esophageal reflux disease without esophagitis: Secondary | ICD-10-CM | POA: Diagnosis not present

## 2016-11-15 DIAGNOSIS — J4520 Mild intermittent asthma, uncomplicated: Secondary | ICD-10-CM | POA: Diagnosis not present

## 2016-11-15 DIAGNOSIS — I10 Essential (primary) hypertension: Secondary | ICD-10-CM

## 2016-11-15 DIAGNOSIS — Z0000 Encounter for general adult medical examination without abnormal findings: Secondary | ICD-10-CM | POA: Diagnosis not present

## 2016-11-15 MED ORDER — ESOMEPRAZOLE MAGNESIUM 40 MG PO CPDR: 40.0000 mg | DELAYED_RELEASE_CAPSULE | Freq: Every day | ORAL | 3 refills | Status: DC

## 2016-11-15 MED ORDER — FLUTICASONE-SALMETEROL 100-50 MCG/DOSE IN AEPB: 1.0000 | INHALATION_SPRAY | Freq: Two times a day (BID) | RESPIRATORY_TRACT | 3 refills | Status: DC

## 2016-11-15 MED ORDER — AMLODIPINE BESYLATE 5 MG PO TABS: 5.0000 mg | ORAL_TABLET | Freq: Every day | ORAL | 3 refills | Status: DC

## 2016-11-15 MED ORDER — SILDENAFIL CITRATE 100 MG PO TABS: 50.0000 mg | ORAL_TABLET | Freq: Every day | ORAL | 11 refills | Status: DC | PRN

## 2016-11-15 MED ORDER — LOSARTAN POTASSIUM 100 MG PO TABS: 100.0000 mg | ORAL_TABLET | Freq: Every day | ORAL | 3 refills | Status: DC

## 2016-11-15 MED ORDER — LEVOTHYROXINE SODIUM 150 MCG PO TABS: 150.0000 ug | ORAL_TABLET | Freq: Every day | ORAL | 3 refills | Status: DC

## 2016-11-15 NOTE — Patient Instructions (Signed)
 Health Maintenance, Male A healthy lifestyle and preventive care is important for your health and wellness. Ask your health care provider about what schedule of regular examinations is right for you. What should I know about weight and diet?  Eat a Healthy Diet  Eat plenty of vegetables, fruits, whole grains, low-fat dairy products, and lean protein.  Do not eat a lot of foods high in solid fats, added sugars, or salt. Maintain a Healthy Weight  Regular exercise can help you achieve or maintain a healthy weight. You should:  Do at least 150 minutes of exercise each week. The exercise should increase your heart rate and make you sweat (moderate-intensity exercise).  Do strength-training exercises at least twice a week. Watch Your Levels of Cholesterol and Blood Lipids  Have your blood tested for lipids and cholesterol every 5 years starting at 70 years of age. If you are at high risk for heart disease, you should start having your blood tested when you are 70 years old. You may need to have your cholesterol levels checked more often if:  Your lipid or cholesterol levels are high.  You are older than 70 years of age.  You are at high risk for heart disease. What should I know about cancer screening? Many types of cancers can be detected early and may often be prevented. Lung Cancer  You should be screened every year for lung cancer if:  You are a current smoker who has smoked for at least 30 years.  You are a former smoker who has quit within the past 15 years.  Talk to your health care provider about your screening options, when you should start screening, and how often you should be screened. Colorectal Cancer  Routine colorectal cancer screening usually begins at 70 years of age and should be repeated every 5-10 years until you are 70 years old. You may need to be screened more often if early forms of precancerous polyps or small growths are found. Your health care provider  may recommend screening at an earlier age if you have risk factors for colon cancer.  Your health care provider may recommend using home test kits to check for hidden blood in the stool.  A small camera at the end of a tube can be used to examine your colon (sigmoidoscopy or colonoscopy). This checks for the earliest forms of colorectal cancer. Prostate and Testicular Cancer  Depending on your age and overall health, your health care provider may do certain tests to screen for prostate and testicular cancer.  Talk to your health care provider about any symptoms or concerns you have about testicular or prostate cancer. Skin Cancer  Check your skin from head to toe regularly.  Tell your health care provider about any new moles or changes in moles, especially if:  There is a change in a mole's size, shape, or color.  You have a mole that is larger than a pencil eraser.  Always use sunscreen. Apply sunscreen liberally and repeat throughout the day.  Protect yourself by wearing long sleeves, pants, a wide-brimmed hat, and sunglasses when outside. What should I know about heart disease, diabetes, and high blood pressure?  If you are 18-39 years of age, have your blood pressure checked every 3-5 years. If you are 40 years of age or older, have your blood pressure checked every year. You should have your blood pressure measured twice-once when you are at a hospital or clinic, and once when you are not at   a hospital or clinic. Record the average of the two measurements. To check your blood pressure when you are not at a hospital or clinic, you can use:  An automated blood pressure machine at a pharmacy.  A home blood pressure monitor.  Talk to your health care provider about your target blood pressure.  If you are between 85-40 years old, ask your health care provider if you should take aspirin to prevent heart disease.  Have regular diabetes screenings by checking your fasting blood sugar  level.  If you are at a normal weight and have a low risk for diabetes, have this test once every three years after the age of 46.  If you are overweight and have a high risk for diabetes, consider being tested at a younger age or more often.  A one-time screening for abdominal aortic aneurysm (AAA) by ultrasound is recommended for men aged 32-75 years who are current or former smokers. What should I know about preventing infection? Hepatitis B  If you have a higher risk for hepatitis B, you should be screened for this virus. Talk with your health care provider to find out if you are at risk for hepatitis B infection. Hepatitis C  Blood testing is recommended for:  Everyone born from [birth year] through 1964.  Anyone with known risk factors for hepatitis C. Sexually Transmitted Diseases (STDs)  You should be screened each year for STDs including gonorrhea and chlamydia if:  You are sexually active and are younger than 70 years of age.  You are older than 70 years of age and your health care provider tells you that you are at risk for this type of infection.  Your sexual activity has changed since you were last screened and you are at an increased risk for chlamydia or gonorrhea. Ask your health care provider if you are at risk.  Talk with your health care provider about whether you are at high risk of being infected with HIV. Your health care provider may recommend a prescription medicine to help prevent HIV infection. What else can I do?  Schedule regular health, dental, and eye exams.  Stay current with your vaccines (immunizations).  Do not use any tobacco products, such as cigarettes, chewing tobacco, and e-cigarettes. If you need help quitting, ask your health care provider.  Limit alcohol intake to no more than 2 drinks per day. One drink equals 12 ounces of beer, 5 ounces of wine, or 1 ounces of hard liquor.  Do not use street drugs.  Do not share needles.  Ask your health  care provider for help if you need support or information about quitting drugs.  Tell your health care provider if you often feel depressed.  Tell your health care provider if you have ever been abused or do not feel safe at home. This information is not intended to replace advice given to you by your health care provider. Make sure you discuss any questions you have with your health care provider. Document Released: 01/22/2008 Document Revised: 03/24/2016 Document Reviewed: 04/29/2015 Elsevier Interactive Patient Education  2017 [place] [facility]) Exercise Recommendation  Being physically active is important to prevent heart disease and stroke, the nation's No. 1and No. 5killers. To improve overall cardiovascular health, we suggest at least 150 minutes per week of moderate exercise or 75 minutes per week of vigorous exercise (or a combination of moderate and vigorous activity). Thirty minutes a day, five times a week is an easy goal  to remember. You will also experience benefits even if you divide your time into two or three segments of 10 to 15 minutes per day.  For people who would benefit from lowering their blood pressure or cholesterol, we recommend 40 minutes of aerobic exercise of moderate to vigorous intensity three to four times a week to lower the risk for heart attack and stroke.  Physical activity is anything that makes you move your body and burn calories.  This includes things like climbing stairs or playing sports. Aerobic exercises benefit your heart, and include walking, jogging, swimming or biking. Strength and stretching exercises are best for overall stamina and flexibility.  The simplest, positive change you can make to effectively improve your heart health is to start walking. It's enjoyable, free, easy, social and great exercise. A walking program is flexible and boasts high success rates because people can stick with it. It's easy for walking  to become a regular and satisfying part of life.   For Overall Cardiovascular Health:  At least 30 minutes of moderate-intensity aerobic activity at least 5 days per week for a total of 150  OR   At least 25 minutes of vigorous aerobic activity at least 3 days per week for a total of 75 minutes; or a combination of moderate- and vigorous-intensity aerobic activity  AND   Moderate- to high-intensity muscle-strengthening activity at least 2 days per week for additional health benefits.  For Lowering Blood Pressure and Cholesterol  An average 40 minutes of moderate- to vigorous-intensity aerobic activity 3 or 4 times per week  What if I can't make it to the time goal? Something is always better than nothing! And everyone has to start somewhere. Even if you've been sedentary for years, today is the day you can begin to make healthy changes in your life. If you don't think you'll make it for 30 or 40 minutes, set a reachable goal for today. You can work up toward your overall goal by increasing your time as you get stronger. Don't let all-or-nothing thinking rob you of doing what you can every day.  Source:http://www.heart.org

## 2016-11-15 NOTE — Progress Notes (Signed)
[facility] 71 y.o.   Chief Complaint  Patient presents with  . Annual Exam  . Medication Refill    Nexium    HISTORY OF PRESENT ILLNESS: This is a 71 y.o. male here for annual exam; no complaints; stays active and exercises Mon-Fri every day; non-smoker.  HPI   Prior to Admission medications   Medication Sig Start Date End Date Taking? Authorizing Provider  albuterol (PROVENTIL HFA;VENTOLIN HFA) 108 (90 Base) MCG/ACT inhaler Inhale 1-2 puffs every 4-6 hours as needed and as directed   Yes Historical Provider, MD  amLODipine (NORVASC) 5 MG tablet Take 5 mg by mouth daily.   Yes Historical Provider, MD  cyclobenzaprine (FLEXERIL) 5 MG tablet Take 1 tablet (5 mg total) by mouth 3 (three) times daily as needed for muscle spasms. 09/07/16  Yes Mancel Bale, PA-C  losartan (COZAAR) 100 MG tablet Take 100 mg by mouth daily.   Yes Historical Provider, MD  olopatadine (PATANOL) 0.1 % ophthalmic solution Place 1 drop into both eyes 2 (two) times daily. 09/07/16  Yes Mancel Bale, PA-C  Thyroid, Pork, 60 MG CAPS Take 60 mg by mouth daily.   Yes Historical Provider, MD  esomeprazole (NEXIUM) 40 MG capsule Take 40 mg by mouth. 06/17/16 09/15/16  Historical Provider, MD  traMADol (ULTRAM) 50 MG tablet Take 1 tablet (50 mg total) by mouth every 8 (eight) hours as needed. Patient not taking: Reported on 11/15/2016 09/07/16   Mancel Bale, PA-C    Allergies  Allergen Reactions  . Aspirin Anaphylaxis    Angioedema,   . Penicillins Anaphylaxis  . Ketorolac Tromethamine     Patient Active Problem List   Diagnosis Date Noted  . Mild intermittent asthma without complication 78/93/8101  . Barrett's esophagus without dysplasia 03/04/2016  . Gastroesophageal reflux disease 08/12/2015  . Benign essential hypertension 08/12/2015  . Varicose veins of lower extremities with other complications 75/05/2584  . ASTHMA 11/14/2007  . OSTEOARTHRITIS 11/14/2007  . COLONIC POLYPS 07/21/2007  . Hypothyroidism  (acquired) 07/21/2007  . HYPOGONADISM, MALE 07/21/2007  . HYPERLIPIDEMIA 07/21/2007  . VARICOCELE 07/21/2007  . 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 Hypertension   . Hypogonadism male   . Thyroid disease    Hypothyroidism  . Varicose veins     Past Surgical History:  Procedure Laterality Date  . VARICOSE VEIN SURGERY     removed vein in left leg    Social History   Social History  . Marital status: Married    Spouse name: N/A  . Number of children: N/A  . Years of education: N/A   Occupational History  . Not on file.   Social History Main Topics  . Smoking status: Never Smoker  . Smokeless tobacco: Never Used  . Alcohol use 5.4 oz/week    5 Cans of beer, 4 Shots of liquor per week  . Drug use: No  . Sexual activity: Not on file   Other Topics Concern  . Not on file   Social History Narrative  . No narrative on file    Family History  Problem Relation Age of Onset  . Other Mother     varicose veins  . Heart disease Father     before age 70  . Heart attack Father   . Cancer Sister   . Cancer Brother      Review of Systems  Constitutional: Negative.  Negative for chills,  fever, malaise/fatigue and weight loss.  HENT: Negative.  Negative for ear pain, hearing loss, nosebleeds and sore throat.   Eyes: Negative.  Negative for blurred vision, double vision and pain.  Respiratory: Negative.  Negative for cough, shortness of breath and wheezing.   Cardiovascular: Negative.  Negative for chest pain, claudication, leg swelling and PND.  Gastrointestinal: Negative.  Negative for abdominal pain, diarrhea, nausea and vomiting.  Genitourinary: Negative.  Negative for dysuria, flank pain and hematuria.  Musculoskeletal: Negative.   Skin: Negative.  Negative for rash.  Neurological: Negative.  Negative for dizziness, sensory change, focal weakness and headaches.  Endo/Heme/Allergies: Negative.     Psychiatric/Behavioral: Negative.   All other systems reviewed and are negative.   Vitals:   11/15/16 0809  BP: 135/80  Pulse: 81  Resp: 18  Temp: 97.8 F (36.6 C)    Physical Exam  Constitutional: He is oriented to person, place, and time. He appears well-developed and well-nourished.  HENT:  Head: Normocephalic and atraumatic.  Nose: Nose normal.  Mouth/Throat: Oropharynx is clear and moist. No oropharyngeal exudate.  Eyes: Conjunctivae and EOM are normal. Pupils are equal, round, and reactive to light.  Neck: Normal range of motion. Neck supple. No JVD present. No thyromegaly present.  Cardiovascular: Normal rate, regular rhythm, normal heart sounds and intact distal pulses.   Pulmonary/Chest: Effort normal and breath sounds normal.  Abdominal: Soft. Bowel sounds are normal. He exhibits no distension. There is no tenderness.  Musculoskeletal: Normal range of motion.  Lymphadenopathy:    He has no cervical adenopathy.  Neurological: He is alert and oriented to person, place, and time. He displays normal reflexes. No sensory deficit. He exhibits normal muscle tone. Coordination normal.  Skin: Skin is warm and dry. Capillary refill takes less than 2 seconds.  Psychiatric: He has a normal mood and affect. His behavior is normal.  Vitals reviewed.    ASSESSMENT & PLAN: Ysabel was seen today for annual exam and medication refill.  Diagnoses and all orders for this visit:  Routine general medical examination at a health care facility -     CBC with Differential -     Comprehensive metabolic panel -     Hemoglobin A1c -     Lipid panel -     TSH -     Hepatitis C antibody screen  Benign essential hypertension  Mild intermittent asthma without complication  Gastroesophageal reflux disease without esophagitis  Thyroid disease  Other orders -     amLODipine (NORVASC) 5 MG tablet; Take 1 tablet (5 mg total) by mouth daily. -     esomeprazole (NEXIUM) 40 MG capsule;  Take 1 capsule (40 mg total) by mouth daily at 12 noon. -     losartan (COZAAR) 100 MG tablet; Take 1 tablet (100 mg total) by mouth daily. -     Cancel: albuterol (PROVENTIL HFA;VENTOLIN HFA) 108 (90 Base) MCG/ACT inhaler;  -     levothyroxine (SYNTHROID, LEVOTHROID) 150 MCG tablet; Take 1 tablet (150 mcg total) by mouth daily. -     Fluticasone-Salmeterol (ADVAIR) 100-50 MCG/DOSE AEPB; Inhale 1 puff into the lungs 2 (two) times daily. -     sildenafil (VIAGRA) 100 MG tablet; Take 0.5-1 tablets (50-100 mg total) by mouth daily as needed for erectile dysfunction.    Patient Instructions    Health Maintenance, Male A healthy lifestyle and preventive care is important for your health and wellness. Ask your health care provider about what schedule of regular  examinations is right for you. What should I know about weight and diet?  Eat a Healthy Diet  Eat plenty of vegetables, fruits, whole grains, low-fat dairy products, and lean protein.  Do not eat a lot of foods high in solid fats, added sugars, or salt. Maintain a Healthy Weight  Regular exercise can help you achieve or maintain a healthy weight. You should:  Do at least 150 minutes of exercise each week. The exercise should increase your heart rate and make you sweat (moderate-intensity exercise).  Do strength-training exercises at least twice a week. Watch Your Levels of Cholesterol and Blood Lipids  Have your blood tested for lipids and cholesterol every 5 years starting at 70 years of age. If you are at high risk for heart disease, you should start having your blood tested when you are 70 years old. You may need to have your cholesterol levels checked more often if:  Your lipid or cholesterol levels are high.  You are older than 70 years of age.  You are at high risk for heart disease. What should I know about cancer screening? Many types of cancers can be detected early and may often be prevented. Lung Cancer  You should  be screened every year for lung cancer if:  You are a current smoker who has smoked for at least 30 years.  You are a former smoker who has quit within the past 15 years.  Talk to your health care provider about your screening options, when you should start screening, and how often you should be screened. Colorectal Cancer  Routine colorectal cancer screening usually begins at 70 years of age and should be repeated every 5-10 years until you are 70 years old. You may need to be screened more often if early forms of precancerous polyps or small growths are found. Your health care provider may recommend screening at an earlier age if you have risk factors for colon cancer.  Your health care provider may recommend using home test kits to check for hidden blood in the stool.  A small camera at the end of a tube can be used to examine your colon (sigmoidoscopy or colonoscopy). This checks for the earliest forms of colorectal cancer. Prostate and Testicular Cancer  Depending on your age and overall health, your health care provider may do certain tests to screen for prostate and testicular cancer.  Talk to your health care provider about any symptoms or concerns you have about testicular or prostate cancer. Skin Cancer  Check your skin from head to toe regularly.  Tell your health care provider about any new moles or changes in moles, especially if:  There is a change in a mole's size, shape, or color.  You have a mole that is larger than a pencil eraser.  Always use sunscreen. Apply sunscreen liberally and repeat throughout the day.  Protect yourself by wearing long sleeves, pants, a wide-brimmed hat, and sunglasses when outside. What should I know about heart disease, diabetes, and high blood pressure?  If you are 86-62 years of age, have your blood pressure checked every 3-5 years. If you are 14 years of age or older, have your blood pressure checked every year. You should have your  blood pressure measured twice-once when you are at a hospital or clinic, and once when you are not at a hospital or clinic. Record the average of the two measurements. To check your blood pressure when you are not at a hospital or clinic, you  can use:  An automated blood pressure machine at a pharmacy.  A home blood pressure monitor.  Talk to your health care provider about your target blood pressure.  If you are between 12-27 years old, ask your health care provider if you should take aspirin to prevent heart disease.  Have regular diabetes screenings by checking your fasting blood sugar level.  If you are at a normal weight and have a low risk for diabetes, have this test once every three years after the age of 43.  If you are overweight and have a high risk for diabetes, consider being tested at a younger age or more often.  A one-time screening for abdominal aortic aneurysm (AAA) by ultrasound is recommended for men aged 20-75 years who are current or former smokers. What should I know about preventing infection? Hepatitis B  If you have a higher risk for hepatitis B, you should be screened for this virus. Talk with your health care provider to find out if you are at risk for hepatitis B infection. Hepatitis C  Blood testing is recommended for:  Everyone born from [birth year] through 1964.  Anyone with known risk factors for hepatitis C. Sexually Transmitted Diseases (STDs)  You should be screened each year for STDs including gonorrhea and chlamydia if:  You are sexually active and are younger than 70 years of age.  You are older than 70 years of age and your health care provider tells you that you are at risk for this type of infection.  Your sexual activity has changed since you were last screened and you are at an increased risk for chlamydia or gonorrhea. Ask your health care provider if you are at risk.  Talk with your health care provider about whether you are at high risk of  being infected with HIV. Your health care provider may recommend a prescription medicine to help prevent HIV infection. What else can I do?  Schedule regular health, dental, and eye exams.  Stay current with your vaccines (immunizations).  Do not use any tobacco products, such as cigarettes, chewing tobacco, and e-cigarettes. If you need help quitting, ask your health care provider.  Limit alcohol intake to no more than 2 drinks per day. One drink equals 12 ounces of beer, 5 ounces of wine, or 1 ounces of hard liquor.  Do not use street drugs.  Do not share needles.  Ask your health care provider for help if you need support or information about quitting drugs.  Tell your health care provider if you often feel depressed.  Tell your health care provider if you have ever been abused or do not feel safe at home. This information is not intended to replace advice given to you by your health care provider. Make sure you discuss any questions you have with your health care provider. Document Released: 01/22/2008 Document Revised: 03/24/2016 Document Reviewed: 04/29/2015 Elsevier Interactive Patient Education  2017 [place] [facility]) Exercise Recommendation  Being physically active is important to prevent heart disease and stroke, the nation's No. 1and No. 5killers. To improve overall cardiovascular health, we suggest at least 150 minutes per week of moderate exercise or 75 minutes per week of vigorous exercise (or a combination of moderate and vigorous activity). Thirty minutes a day, five times a week is an easy goal to remember. You will also experience benefits even if you divide your time into two or three segments of 10 to 15 minutes per day.  For  people who would benefit from lowering their blood pressure or cholesterol, we recommend 40 minutes of aerobic exercise of moderate to vigorous intensity three to four times a week to lower the risk for heart attack  and stroke.  Physical activity is anything that makes you move your body and burn calories.  This includes things like climbing stairs or playing sports. Aerobic exercises benefit your heart, and include walking, jogging, swimming or biking. Strength and stretching exercises are best for overall stamina and flexibility.  The simplest, positive change you can make to effectively improve your heart health is to start walking. It's enjoyable, free, easy, social and great exercise. A walking program is flexible and boasts high success rates because people can stick with it. It's easy for walking to become a regular and satisfying part of life.   For Overall Cardiovascular Health:  At least 30 minutes of moderate-intensity aerobic activity at least 5 days per week for a total of 150  OR   At least 25 minutes of vigorous aerobic activity at least 3 days per week for a total of 75 minutes; or a combination of moderate- and vigorous-intensity aerobic activity  AND   Moderate- to high-intensity muscle-strengthening activity at least 2 days per week for additional health benefits.  For Lowering Blood Pressure and Cholesterol  An average 40 minutes of moderate- to vigorous-intensity aerobic activity 3 or 4 times per week  What if I can't make it to the time goal? Something is always better than nothing! And everyone has to start somewhere. Even if you've been sedentary for years, today is the day you can begin to make healthy changes in your life. If you don't think you'll make it for 30 or 40 minutes, set a reachable goal for today. You can work up toward your overall goal by increasing your time as you get stronger. Don't let all-or-nothing thinking rob you of doing what you can every day.  Source:[email or web address].[email or web address] Pop, MD [facility]

## 2016-11-16 LAB — LIPID PANEL
CHOL/HDL RATIO: 3.5 ratio (ref 0.0–5.0)
Cholesterol, Total: 211 mg/dL — ABNORMAL HIGH (ref 100–199)
HDL: 60 mg/dL (ref >39)
LDL Calculated: 137 mg/dL — ABNORMAL HIGH (ref 0–99)
Triglycerides: 72 mg/dL (ref 0–149)
VLDL CHOLESTEROL CAL: 14 mg/dL (ref 5–40)

## 2016-11-16 LAB — HEPATITIS C ANTIBODY: Hep C Virus Ab: <0.1 s/co ratio (ref 0.0–0.9)

## 2016-11-16 LAB — CBC WITH DIFFERENTIAL/PLATELET
BASOS: 1 %
Basophils Absolute: 0.1 10*3/uL (ref 0.0–0.2)
EOS (ABSOLUTE): 1.1 10*3/uL — ABNORMAL HIGH (ref 0.0–0.4)
EOS: 15 %
HEMATOCRIT: 44.9 % (ref 37.5–51.0)
Hemoglobin: 15.2 g/dL (ref 13.0–17.7)
Immature Grans (Abs): 0 10*3/uL (ref 0.0–0.1)
Immature Granulocytes: 0 %
LYMPHS ABS: 2 10*3/uL (ref 0.7–3.1)
Lymphs: 29 %
MCH: 30 pg (ref 26.6–33.0)
MCHC: 33.9 g/dL (ref 31.5–35.7)
MCV: 89 fL (ref 79–97)
MONOS ABS: 0.4 10*3/uL (ref 0.1–0.9)
Monocytes: 6 %
Neutrophils Absolute: 3.4 10*3/uL (ref 1.4–7.0)
Neutrophils: 49 %
Platelets: 326 10*3/uL (ref 150–379)
RBC: 5.06 x10E6/uL (ref 4.14–5.80)
RDW: 13.8 % (ref 12.3–15.4)
WBC: 7 10*3/uL (ref 3.4–10.8)

## 2016-11-16 LAB — COMPREHENSIVE METABOLIC PANEL
A/G RATIO: 1.7 (ref 1.2–2.2)
ALK PHOS: 80 IU/L (ref 39–117)
ALT: 38 IU/L (ref 0–44)
AST: 28 IU/L (ref 0–40)
Albumin: 4.6 g/dL (ref 3.5–4.8)
BUN/Creatinine Ratio: 23 (ref 10–24)
BUN: 19 mg/dL (ref 8–27)
Bilirubin Total: 0.3 mg/dL (ref 0.0–1.2)
CO2: 23 mmol/L (ref 18–29)
Calcium: 9.3 mg/dL (ref 8.6–10.2)
Chloride: 101 mmol/L (ref 96–106)
Creatinine, Ser: 0.82 mg/dL (ref 0.76–1.27)
GFR calc Af Amer: 104 mL/min/{1.73_m2} (ref >59)
GFR calc non Af Amer: 90 mL/min/{1.73_m2} (ref >59)
GLOBULIN, TOTAL: 2.7 g/dL (ref 1.5–4.5)
Glucose: 109 mg/dL — ABNORMAL HIGH (ref 65–99)
POTASSIUM: 4.4 mmol/L (ref 3.5–5.2)
SODIUM: 144 mmol/L (ref 134–144)
Total Protein: 7.3 g/dL (ref 6.0–8.5)

## 2016-11-16 LAB — HEMOGLOBIN A1C
ESTIMATED AVERAGE GLUCOSE: 120 mg/dL
HEMOGLOBIN A1C: 5.8 % — AB (ref 4.8–5.6)

## 2016-11-16 LAB — TSH: TSH: 2.88 u[IU]/mL (ref 0.450–4.500)

## 2016-12-06 ENCOUNTER — Ambulatory Visit (INDEPENDENT_AMBULATORY_CARE_PROVIDER_SITE_OTHER): Payer: Medicare Other | Admitting: Family Medicine

## 2016-12-06 ENCOUNTER — Encounter: Payer: Self-pay | Admitting: Family Medicine

## 2016-12-06 VITALS — BP 125/72 | HR 73 | Temp 98.1°F | Resp 17 | Ht 64.0 in | Wt 187.0 lb

## 2016-12-06 DIAGNOSIS — L304 Erythema intertrigo: Secondary | ICD-10-CM

## 2016-12-06 DIAGNOSIS — R51 Headache: Secondary | ICD-10-CM

## 2016-12-06 DIAGNOSIS — R519 Headache, unspecified: Secondary | ICD-10-CM

## 2016-12-06 LAB — POCT SKIN KOH: Skin KOH, POC: NEGATIVE

## 2016-12-06 MED ORDER — CLOTRIMAZOLE-BETAMETHASONE 1-0.05 % EX CREA: 1.0000 "application " | TOPICAL_CREAM | Freq: Two times a day (BID) | CUTANEOUS | 0 refills | Status: DC

## 2016-12-06 NOTE — Progress Notes (Signed)
Patient ID: Clarence Sanders, male    DOB: Apr 20, 1946  Age: 71 y.o. MRN: [id number]  Chief Complaint  Patient presents with  . Rash    all over onset 2 weeks   . Headache    Subjective:   71 year old man who is here with a rash that is been bothering him for several weeks. It has occurred in the creases of his neck, especially the lower neck where he has skin folds, and a little in the popliteal fossa of both legs. It itches a lot. He has tried all the various OTC creams that they have at the house and nothing is helping.  He also has been having morning headache recently. It is frontal.  Current allergies, medications, problem list, past/family and social histories reviewed.  Objective:  BP 125/72 (BP Location: Right Arm, Patient Position: Sitting, Cuff Size: Normal)   Pulse 73   Temp 98.1 F (36.7 C) (Oral)   Resp 17   Ht 5\' 4"  (1.626 m)   Wt 187 lb (84.8 kg)   SpO2 95%   BMI 32.10 kg/m   TMs are normal. Eyes PERRLA. EOMs intact. Throat clear. Neck supple without nodes. No carotid bruits. He has bands of rash in the creases of his neck. Also has a couple small spots of it in the popliteal fossa areas. A skin scraping was done of the places in his neck and only a small amount of cellular tissue could be obtained since it was a moist skin in and quite shiny. It was not flaky at all. This was examined under the microscope and although cells are seen cannot see any yeast or fungal particles.  Assessment & Plan:   Assessment: 1. Intertrigo   2. Nonintractable headache, unspecified chronicity pattern, unspecified headache type       Plan: See discussion in instructions.  No orders of the defined types were placed in this encounter.   Meds ordered this encounter  Medications  . clotrimazole-betamethasone (LOTRISONE) cream    Sig: Apply 1 application topically 2 (two) times daily.    Dispense:  30 g    Refill:  0         Patient Instructions   Use a little bit of the  betamethasone/clotrimazole (Lotrimin) cream twice daily on the areas of rash  If the rash is not improving after 2 weeks stop using the medicine and come back to get it rechecked. If it does not go away we might need to do a small punch biopsy to send for further testing.  This rash appears to be what we call intertrigo, but it can be caused by a fungus called monilia or by an allergic process called eczema.  I do not find any major cause of your headache. If you have further headaches you can take over-the-counter acetaminophen (Tylenol) extra strength 500 mg 2 pills 3 times daily as necessary. However if the headache continues to come back or is getting worse please come in for a recheck.  Return as needed    IF you received an x-ray today, you will receive an invoice from [facility]. Please contact [facility] at [phone] with questions or concerns regarding your invoice.   IF you received labwork today, you will receive an invoice from [facility]. Please contact [facility] at [phone] with questions or concerns regarding your invoice.   Our billing staff will not be able to assist you with questions regarding bills from these companies.  You will be contacted with the  lab results as soon as they are available. The fastest way to get your results is to activate your My Chart account. Instructions are located on the last page of this paperwork. If you have not heard from [place] regarding the results in 2 weeks, please contact this office.        Return if symptoms worsen or fail to improve.   Clarence Wrightsman, MD 12/06/2016

## 2016-12-06 NOTE — Patient Instructions (Addendum)
Use a little bit of the betamethasone/clotrimazole (Lotrimin) cream twice daily on the areas of rash  If the rash is not improving after 2 weeks stop using the medicine and come back to get it rechecked. If it does not go away we might need to do a small punch biopsy to send for further testing.  This rash appears to be what we call intertrigo, but it can be caused by a fungus called monilia or by an allergic process called eczema.  I do not find any major cause of your headache. If you have further headaches you can take over-the-counter acetaminophen (Tylenol) extra strength 500 mg 2 pills 3 times daily as necessary. However if the headache continues to come back or is getting worse please come in for a recheck.  Return as needed    IF you received an x-ray today, you will receive an invoice from [facility]. Please contact [facility] at [phone] with questions or concerns regarding your invoice.   IF you received labwork today, you will receive an invoice from [place]. Please contact [facility] at [phone] with questions or concerns regarding your invoice.   Our billing staff will not be able to assist you with questions regarding bills from these companies.  You will be contacted with the lab results as soon as they are available. The fastest way to get your results is to activate your My Chart account. Instructions are located on the last page of this paperwork. If you have not heard from [place] regarding the results in 2 weeks, please contact this office.

## 2017-06-22 ENCOUNTER — Encounter: Payer: Self-pay | Admitting: Emergency Medicine

## 2017-06-22 ENCOUNTER — Other Ambulatory Visit: Payer: Self-pay

## 2017-06-22 ENCOUNTER — Ambulatory Visit (INDEPENDENT_AMBULATORY_CARE_PROVIDER_SITE_OTHER): Payer: Medicare Other | Admitting: Emergency Medicine

## 2017-06-22 VITALS — BP 118/74 | HR 70 | Temp 98.4°F | Resp 16 | Ht 65.5 in | Wt 181.8 lb

## 2017-06-22 DIAGNOSIS — I10 Essential (primary) hypertension: Secondary | ICD-10-CM | POA: Diagnosis not present

## 2017-06-22 MED ORDER — LISINOPRIL 20 MG PO TABS
20.0000 mg | ORAL_TABLET | Freq: Every day | ORAL | 3 refills | Status: DC
Start: 2017-06-22 — End: 2020-02-13

## 2017-06-22 NOTE — Patient Instructions (Addendum)
   IF you received an x-ray today, you will receive an invoice from [facility]. Please contact [facility] at [phone] with questions or concerns regarding your invoice.   IF you received labwork today, you will receive an invoice from [facility]. Please contact [facility] at [phone] with questions or concerns regarding your invoice.   Our billing staff will not be able to assist you with questions regarding bills from these companies.  You will be contacted with the lab results as soon as they are available. The fastest way to get your results is to activate your My Chart account. Instructions are located on the last page of this paperwork. If you have not heard from us regarding the results in 2 weeks, please contact this office.     Hipertensin Hypertension El trmino hipertensin es otra forma de denominar a la presin arterial elevada. La presin arterial elevada fuerza al corazn a trabajar ms para bombear la sangre. Esto puede causar problemas con el paso del tiempo. Una lectura de presin arterial est compuesta por 2 nmeros. Hay un nmero superior (sistlico) sobre un nmero inferior (diastlico). Lo ideal es tener la presin arterial por debajo de 120/80. Las decisiones saludables pueden ayudarle a disminuir su presin arterial. Es posible que necesite medicamentos que le ayuden a disminuir su presin arterial si:  Su presin arterial no disminuye mediante decisiones saludables.  Su presin arterial est por encima de 130/80.  Siga estas instrucciones en su casa: Comida y bebida  Si se lo indican, siga el plan de alimentacin de DASH (Dietary Approaches to Stop Hypertension, Maneras de alimentarse para detener la hipertensin). Esta dieta incluye: ? Que la mitad del plato de cada comida sea de frutas y verduras. ? Que un cuarto del plato de cada comida sea de cereales integrales. Los cereales integrales incluyen pasta integral, arroz integral y pan  integral. ? Comer y beber productos lcteos con bajo contenido de grasa, como leche descremada o yogur bajo en grasas. ? Que un cuarto del plato de cada comida sea de protenas bajas en grasa (magras). Las protenas bajas en grasa incluyen pescado, pollo sin piel, huevos, frijoles y tofu. ? Evitar consumir carne grasa, carne curada y procesada, o pollo con piel. ? Evitar consumir alimentos prehechos o procesados.  Consuma menos de 1500 mg de sal (sodio) por da.  Limite el consumo de alcohol a no ms de 1 medida por da si es mujer y no est embarazada y a 2 medidas por da si es hombre. Una medida equivale a 12onzas de cerveza, 5onzas de vino o 1onzas de bebidas alcohlicas de alta graduacin. Estilo de vida  Trabaje con su mdico para mantenerse en un peso saludable o para perder peso. Pregntele a su mdico cul es el peso recomendable para usted.  Realice al menos 30 minutos de ejercicio que haga que se acelere su corazn (ejercicio aerbico) la mayora de los das de la semana. Estos pueden incluir caminar, nadar o andar en bicicleta.  Realice al menos 30 minutos de ejercicio que fortalezca sus msculos (ejercicios de resistencia) al menos 3 das a la semana. Estos pueden incluir levantar pesas o hacer pilates.  No consuma ningn producto que contenga nicotina o tabaco. Esto incluye cigarrillos y cigarrillos electrnicos. Si necesita ayuda para dejar de fumar, consulte al mdico.  Controle su presin arterial en su casa tal como le indic el mdico.  Concurra a todas las visitas de control como se lo haya indicado el mdico. Esto es   importante. Medicamentos  [employer] de venta libre y los recetados solamente como se lo haya indicado el mdico. Siga cuidadosamente las indicaciones.  No omita las dosis de medicamentos para la presin arterial. Los medicamentos pierden eficacia si omite dosis. El hecho de omitir las dosis tambin [place] el riesgo de otros  problemas.  Pregntele a su mdico a qu efectos secundarios o reacciones a los Careers information officer. Comunquese con un mdico si:  Piensa que tiene [place] reaccin a los medicamentos que est tomando.  Tiene dolores de cabeza frecuentes (recurrentes).  Siente mareos.  Tiene hinchazn en los tobillos.  Tiene problemas de visin. Solicite ayuda de inmediato si:  Siente un dolor de cabeza muy intenso.  Comienza a sentirse confundido.  Se siente dbil o adormecido.  Siente que va a desmayarse.  Siente un dolor muy intenso en: ? El pecho. ? El vientre (abdomen).  Devuelve (vomita) ms de una vez.  Tiene dificultad para respirar. Resumen  El trmino hipertensin es otra forma de denominar a la presin arterial elevada.  Las decisiones saludables pueden ayudarle a disminuir su presin arterial. Si no puede controlar su presin arterial mediante decisiones saludables, es posible que deba tomar medicamentos. Esta informacin no tiene Marine scientist el consejo del mdico. Asegrese de hacerle al mdico cualquier pregunta que tenga. Document Released: 01/13/2010 Document Revised: 07/07/2016 Document Reviewed: 07/07/2016 Elsevier Interactive Patient Education  2018 [employer].  Hypertension Hypertension, commonly called high blood pressure, is when the force of blood pumping through the arteries is too strong. The arteries are the blood vessels that carry blood from the heart throughout the body. Hypertension forces the heart to work harder to pump blood and may cause arteries to become narrow or stiff. Having untreated or uncontrolled hypertension can cause heart attacks, strokes, kidney disease, and other problems. A blood pressure reading consists of a higher number over a lower number. Ideally, your blood pressure should be below 120/80. The first ("top") number is called the systolic pressure. It is a measure of the pressure in your arteries as your heart  beats. The second ("bottom") number is called the diastolic pressure. It is a measure of the pressure in your arteries as the heart relaxes. What are the causes? The cause of this condition is not known. What increases the risk? Some risk factors for high blood pressure are under your control. Others are not. Factors you can change  Smoking.  Having type 2 diabetes mellitus, high cholesterol, or both.  Not getting enough exercise or physical activity.  Being overweight.  Having too much fat, sugar, calories, or salt (sodium) in your diet.  Drinking too much alcohol. Factors that are difficult or impossible to change  Having chronic kidney disease.  Having a family history of high blood pressure.  Age. Risk increases with age.  Race. You may be at higher risk if you are African-American.  Gender. Men are at higher risk than women before age 32. After age 69, women are at higher risk than men.  Having obstructive sleep apnea.  Stress. What are the signs or symptoms? Extremely high blood pressure (hypertensive crisis) may cause:  Headache.  Anxiety.  Shortness of breath.  Nosebleed.  Nausea and vomiting.  Severe chest pain.  Jerky movements you cannot control (seizures).  How is this diagnosed? This condition is diagnosed by measuring your blood pressure while you are seated, with your arm resting on a surface. The cuff of the blood pressure monitor will  be placed directly against the skin of your upper arm at the level of your heart. It should be measured at least twice using the same arm. Certain conditions can cause a difference in blood pressure between your right and left arms. Certain factors can cause blood pressure readings to be lower or higher than normal (elevated) for a short period of time:  When your blood pressure is higher when you are in a health care provider's office than when you are at home, this is called white coat hypertension. Most people  with this condition do not need medicines.  When your blood pressure is higher at home than when you are in a health care provider's office, this is called masked hypertension. Most people with this condition may need medicines to control blood pressure.  If you have a high blood pressure reading during one visit or you have normal blood pressure with other risk factors:  You may be asked to return on a different day to have your blood pressure checked again.  You may be asked to monitor your blood pressure at home for 1 week or longer.  If you are diagnosed with hypertension, you may have other blood or imaging tests to help your health care provider understand your overall risk for other conditions. How is this treated? This condition is treated by making healthy lifestyle changes, such as eating healthy foods, exercising more, and reducing your alcohol intake. Your health care provider may prescribe medicine if lifestyle changes are not enough to get your blood pressure under control, and if:  Your systolic blood pressure is above 130.  Your diastolic blood pressure is above 80.  Your personal target blood pressure may vary depending on your medical conditions, your age, and other factors. Follow these instructions at home: Eating and drinking  Eat a diet that is high in fiber and potassium, and low in sodium, added sugar, and fat. An example eating plan is called the DASH (Dietary Approaches to Stop Hypertension) diet. To eat this way: ? Eat plenty of fresh fruits and vegetables. Try to fill half of your plate at each meal with fruits and vegetables. ? Eat whole grains, such as whole wheat pasta, brown rice, or whole grain bread. Fill about one quarter of your plate with whole grains. ? Eat or drink low-fat dairy products, such as skim milk or low-fat yogurt. ? Avoid fatty cuts of meat, processed or cured meats, and poultry with skin. Fill about one quarter of your plate with lean  proteins, such as fish, chicken without skin, beans, eggs, and tofu. ? Avoid premade and processed foods. These tend to be higher in sodium, added sugar, and fat.  Reduce your daily sodium intake. Most people with hypertension should eat less than 1,500 mg of sodium a day.  Limit alcohol intake to no more than 1 drink a day for nonpregnant women and 2 drinks a day for men. One drink equals 12 oz of beer, 5 oz of wine, or 1 oz of hard liquor. Lifestyle  Work with your health care provider to maintain a healthy body weight or to lose weight. Ask what an ideal weight is for you.  Get at least 30 minutes of exercise that causes your heart to beat faster (aerobic exercise) most days of the week. Activities may include walking, swimming, or biking.  Include exercise to strengthen your muscles (resistance exercise), such as pilates or lifting weights, as part of your weekly exercise routine. Try to do  these types of exercises for 30 minutes at least 3 days a week.  Do not use any products that contain nicotine or tobacco, such as cigarettes and e-cigarettes. If you need help quitting, ask your health care provider.  Monitor your blood pressure at home as told by your health care provider.  Keep all follow-up visits as told by your health care provider. This is important. Medicines  Take over-the-counter and prescription medicines only as told by your health care provider. Follow directions carefully. Blood pressure medicines must be taken as prescribed.  Do not skip doses of blood pressure medicine. Doing this puts you at risk for problems and can make the medicine less effective.  Ask your health care provider about side effects or reactions to medicines that you should watch for. Contact a health care provider if:  You think you are having a reaction to a medicine you are taking.  You have headaches that keep coming back (recurring).  You feel dizzy.  You have swelling in your  ankles.  You have trouble with your vision. Get help right away if:  You develop a severe headache or confusion.  You have unusual weakness or numbness.  You feel faint.  You have severe pain in your chest or abdomen.  You vomit repeatedly.  You have trouble breathing. Summary  Hypertension is when the force of blood pumping through your arteries is too strong. If this condition is not controlled, it may put you at risk for serious complications.  Your personal target blood pressure may vary depending on your medical conditions, your age, and other factors. For most people, a normal blood pressure is less than 120/80.  Hypertension is treated with lifestyle changes, medicines, or a combination of both. Lifestyle changes include weight loss, eating a healthy, low-sodium diet, exercising more, and limiting alcohol. This information is not intended to replace advice given to you by your health care provider. Make sure you discuss any questions you have with your health care provider. Document Released: 07/26/2005 Document Revised: 06/23/2016 Document Reviewed: 06/23/2016 Elsevier Interactive Patient Education  [employer].

## 2017-06-22 NOTE — Progress Notes (Signed)
BP Readings from Last 3 Encounters:  06/22/17 118/74  12/06/16 125/72  11/15/16 135/80   Adriene Sliger 71 y.o.   Chief Complaint  Patient presents with  . Headache    per patient he thinks Losartan is causing and wants to change Rx    HISTORY OF PRESENT ILLNESS: This is a 71 y.o. male with h/o HTN wants Losartan changed due to recent bad press.  HPI   Prior to Admission medications   Medication Sig Start Date End Date Taking? Authorizing Provider  clotrimazole-betamethasone (LOTRISONE) cream Apply 1 application topically 2 (two) times daily. 12/06/16  Yes Posey Boyer, MD  Fluticasone-Salmeterol (ADVAIR) 100-50 MCG/DOSE AEPB Inhale 1 puff into the lungs 2 (two) times daily. 11/15/16  Yes Burman Bruington, Ines Bloomer, MD  levothyroxine (SYNTHROID, LEVOTHROID) 150 MCG tablet Take 1 tablet (150 mcg total) by mouth daily. 11/15/16  Yes Jadin Kagel, Ines Bloomer, MD  traMADol (ULTRAM) 50 MG tablet Take 1 tablet (50 mg total) by mouth every 8 (eight) hours as needed. 09/07/16  Yes Weber, Sarah L, PA-C  albuterol (PROVENTIL HFA;VENTOLIN HFA) 108 (90 Base) MCG/ACT inhaler Inhale 1-2 puffs every 4-6 hours as needed and as directed    [provider]  amLODipine (NORVASC) 5 MG tablet Take 1 tablet (5 mg total) by mouth daily. 11/15/16 02/13/17  Horald Pollen, MD  esomeprazole (NEXIUM) 40 MG capsule Take 1 capsule (40 mg total) by mouth daily at 12 noon. 11/15/16 02/13/17  Horald Pollen, MD  losartan (COZAAR) 100 MG tablet Take 1 tablet (100 mg total) by mouth daily. 11/15/16 02/13/17  Horald Pollen, MD    Allergies  Allergen Reactions  . Aspirin Anaphylaxis    Angioedema,   . Penicillins Anaphylaxis  . Ketorolac Tromethamine     Patient Active Problem List   Diagnosis Date Noted  . Routine general medical examination at a health care facility 11/15/2016  . Mild intermittent asthma without complication 02/54/2706  . Barrett's esophagus without dysplasia 03/04/2016  .  Gastroesophageal reflux disease 08/12/2015  . Benign essential hypertension 08/12/2015  . Varicose veins of lower extremities with other complications 23/76/2831  . ASTHMA 11/14/2007  . OSTEOARTHRITIS 11/14/2007  . COLONIC POLYPS 07/21/2007  . Hypothyroidism (acquired) 07/21/2007  . HYPOGONADISM, MALE 07/21/2007  . HYPERLIPIDEMIA 07/21/2007  . VARICOCELE 07/21/2007  . 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 Allergy   . Hypertension   . Hypogonadism male   . Thyroid disease    Hypothyroidism  . Varicose veins     Past Surgical History:  Procedure Laterality Date  . VARICOSE VEIN SURGERY     removed vein in left leg    Social History   Socioeconomic History  . Marital status: Married    Spouse name: Not on file  . Number of children: Not on file  . Years of education: Not on file  . Highest education level: Not on file  Social Needs  . Financial resource strain: Not on file  . Food insecurity - worry: Not on file  . Food insecurity - inability: Not on file  . Transportation needs - medical: Not on file  . Transportation needs - non-medical: Not on file  Occupational History  . Not on file  Tobacco Use  . Smoking status: Never Smoker  . Smokeless tobacco: Never Used  Substance and Sexual Activity  . Alcohol use: Yes    Alcohol/week: 5.4 oz  Types: 5 Cans of beer, 4 Shots of liquor per week  . Drug use: No  . Sexual activity: Not on file  Other Topics Concern  . Not on file  Social History Narrative  . Not on file    Family History  Problem Relation Age of Onset  . Other Mother        varicose veins  . Heart disease Father        before age 75  . Heart attack Father   . Cancer Sister   . Cancer Brother      Review of Systems  Constitutional: Negative.  Negative for fever.  HENT: Negative.  Negative for sore throat.   Eyes: Negative.   Respiratory: Negative.  Negative for cough and  shortness of breath.   Cardiovascular: Negative.  Negative for chest pain and palpitations.  Gastrointestinal: Negative for abdominal pain, diarrhea, nausea and vomiting.  Genitourinary: Negative.   Musculoskeletal: Negative.   Skin: Negative.  Negative for rash.  Neurological: Positive for headaches.  Endo/Heme/Allergies: Negative.   All other systems reviewed and are negative.    Vitals:   06/22/17 1448  BP: 118/74  Pulse: 70  Resp: 16  Temp: 98.4 F (36.9 C)  SpO2: 95%     Physical Exam  Constitutional: He is oriented to person, place, and time. He appears well-developed and well-nourished.  HENT:  Head: Normocephalic and atraumatic.  Mouth/Throat: Oropharynx is clear and moist.  Eyes: Conjunctivae and EOM are normal. Pupils are equal, round, and reactive to light.  Neck: Normal range of motion. Neck supple. No JVD present. No tracheal deviation present.  Cardiovascular: Normal rate, regular rhythm, normal heart sounds and intact distal pulses.  Pulmonary/Chest: Effort normal and breath sounds normal.  Abdominal: Soft. Bowel sounds are normal. He exhibits no distension. There is no tenderness.  Musculoskeletal: Normal range of motion.  Lymphadenopathy:    He has no cervical adenopathy.  Neurological: He is alert and oriented to person, place, and time. He displays normal reflexes. No cranial nerve deficit or sensory deficit. He exhibits normal muscle tone. Coordination normal.  Skin: Skin is warm and dry. Capillary refill takes less than 2 seconds. No rash noted.  Psychiatric: He has a normal mood and affect. His behavior is normal.  Vitals reviewed.  Benign essential hypertension Doing well; will change from Losartan to Lisinopril.    ASSESSMENT & PLAN: Ja was seen today for headache.  Diagnoses and all orders for this visit:  Benign essential hypertension -     lisinopril (PRINIVIL,ZESTRIL) 20 MG tablet; Take 1 tablet (20 mg total) daily by  mouth.    Patient Instructions       IF you received an x-ray today, you will receive an invoice from [facility]. Please contact [facility] at [phone] with questions or concerns regarding your invoice.   IF you received labwork today, you will receive an invoice from [facility]. Please contact [facility] at [phone] with questions or concerns regarding your invoice.   Our billing staff will not be able to assist you with questions regarding bills from these companies.  You will be contacted with the lab results as soon as they are available. The fastest way to get your results is to activate your My Chart account. Instructions are located on the last page of this paperwork. If you have not heard from [place] regarding the results in 2 weeks, please contact this office.     Hipertensin Hypertension El trmino hipertensin es [place]  forma de denominar a la presin arterial elevada. La presin arterial elevada fuerza al corazn a trabajar ms para bombear la sangre. Esto puede causar problemas con el paso del [place]. Una lectura de presin arterial est compuesta por 2 nmeros. Hay un nmero superior (sistlico) sobre un nmero inferior (diastlico). Lo ideal es tener la presin arterial por debajo de 120/80. Las decisiones saludables pueden ayudarle a disminuir su presin arterial. Es posible que necesite medicamentos que le ayuden a disminuir su presin arterial si:  Su presin arterial no disminuye mediante decisiones saludables.  Su presin arterial est por encima de 130/80.  Siga estas instrucciones en su casa: Comida y bebida  Si se lo indican, siga el plan de alimentacin de DASH (Dietary Approaches to Stop Hypertension, Maneras de alimentarse para detener la hipertensin). Esta dieta incluye: ? Que la mitad del plato de cada comida sea de frutas y verduras. ? Que un cuarto del plato de cada comida sea de cereales integrales. Los cereales integrales incluyen  pasta integral, arroz integral y pan integral. ? Comer y beber productos lcteos con bajo contenido de [place], como leche descremada o yogur bajo en grasas. ? Que un cuarto del plato de cada comida sea de protenas bajas en grasa (magras). Las protenas bajas en grasa incluyen pescado, pollo sin piel, huevos, frijoles y tofu. ? Evitar consumir carne grasa, carne curada y procesada, o pollo con piel. ? Evitar consumir alimentos prehechos o procesados.  Consuma menos de 1500 mg de sal (sodio) por da.  Limite el consumo de alcohol a no ms de 1 medida por da si es mujer y no est Music therapist y a 2 medidas por da si es hombre. Una medida equivale a 12onzas de cerveza, 5onzas de vino o 1onzas de bebidas alcohlicas de alta graduacin. Estilo de vida  Trabaje con su mdico para mantenerse en un peso saludable o para perder peso. Pregntele a su mdico cul es el peso recomendable para usted.  Realice al menos 30 minutos de ejercicio que haga que se acelere su corazn (ejercicio Arboriculturist) la [employer] de la [place]. Estos pueden incluir caminar, nadar o andar en bicicleta.  Realice al menos 30 minutos de ejercicio que fortalezca sus msculos (ejercicios de resistencia) al menos 3 das a la [place]. Estos pueden incluir levantar pesas o hacer pilates.  No consuma ningn producto que contenga nicotina o tabaco. Esto incluye cigarrillos y cigarrillos electrnicos. Si necesita ayuda para dejar de fumar, consulte al [employer].  Controle su presin arterial en su casa tal como le indic el mdico.  Concurra a todas las visitas de control como se lo haya indicado el mdico. Esto es importante. Medicamentos  [employer] de venta libre y los recetados solamente como se lo haya indicado el mdico. Siga cuidadosamente las indicaciones.  No omita las dosis de medicamentos para la presin arterial. Los medicamentos pierden eficacia si omite dosis. El hecho de omitir las dosis tambin  [place] el riesgo de otros problemas.  Pregntele a su mdico a qu efectos secundarios o reacciones a los Careers information officer. Comunquese con un mdico si:  Piensa que tiene [place] reaccin a los medicamentos que est tomando.  Tiene dolores de cabeza frecuentes (recurrentes).  Siente mareos.  Tiene hinchazn en los tobillos.  Tiene problemas de visin. Solicite ayuda de inmediato si:  Siente un dolor de cabeza muy intenso.  Comienza a sentirse confundido.  Se siente dbil o adormecido.  Siente que va a desmayarse.  Siente un dolor muy intenso en: ? El pecho. ? El vientre (abdomen).  Devuelve (vomita) ms de una vez.  Tiene dificultad para respirar. Resumen  El trmino hipertensin es otra forma de denominar a la presin arterial elevada.  Las decisiones saludables pueden ayudarle a disminuir su presin arterial. Si no puede controlar su presin arterial mediante decisiones saludables, es posible que deba tomar medicamentos. Esta informacin no tiene Marine scientist el consejo del mdico. Asegrese de hacerle al mdico cualquier pregunta que tenga. Document Released: 01/13/2010 Document Revised: 07/07/2016 Document Reviewed: 07/07/2016 Elsevier Interactive Patient Education  2018 [employer].  Hypertension Hypertension, commonly called high blood pressure, is when the force of blood pumping through the arteries is too strong. The arteries are the blood vessels that carry blood from the heart throughout the body. Hypertension forces the heart to work harder to pump blood and may cause arteries to become narrow or stiff. Having untreated or uncontrolled hypertension can cause heart attacks, strokes, kidney disease, and other problems. A blood pressure reading consists of a higher number over a lower number. Ideally, your blood pressure should be below 120/80. The first ("top") number is called the systolic pressure. It is a measure of the pressure in your  arteries as your heart beats. The second ("bottom") number is called the diastolic pressure. It is a measure of the pressure in your arteries as the heart relaxes. What are the causes? The cause of this condition is not known. What increases the risk? Some risk factors for high blood pressure are under your control. Others are not. Factors you can change  Smoking.  Having type 2 diabetes mellitus, high cholesterol, or both.  Not getting enough exercise or physical activity.  Being overweight.  Having too much fat, sugar, calories, or salt (sodium) in your diet.  Drinking too much alcohol. Factors that are difficult or impossible to change  Having chronic kidney disease.  Having a family history of high blood pressure.  Age. Risk increases with age.  Race. You may be at higher risk if you are African-American.  Gender. Men are at higher risk than women before age 62. After age 25, women are at higher risk than men.  Having obstructive sleep apnea.  Stress. What are the signs or symptoms? Extremely high blood pressure (hypertensive crisis) may cause:  Headache.  Anxiety.  Shortness of breath.  Nosebleed.  Nausea and vomiting.  Severe chest pain.  Jerky movements you cannot control (seizures).  How is this diagnosed? This condition is diagnosed by measuring your blood pressure while you are seated, with your arm resting on a surface. The cuff of the blood pressure monitor will be placed directly against the skin of your upper arm at the level of your heart. It should be measured at least twice using the same arm. Certain conditions can cause a difference in blood pressure between your right and left arms. Certain factors can cause blood pressure readings to be lower or higher than normal (elevated) for a short period of time:  When your blood pressure is higher when you are in a health care provider's office than when you are at home, this is called white coat  hypertension. Most people with this condition do not need medicines.  When your blood pressure is higher at home than when you are in a health care provider's office, this is called masked hypertension. Most people with this condition may need medicines to control blood pressure.  If you have a  high blood pressure reading during one visit or you have normal blood pressure with other risk factors:  You may be asked to return on a different day to have your blood pressure checked again.  You may be asked to monitor your blood pressure at home for 1 week or longer.  If you are diagnosed with hypertension, you may have other blood or imaging tests to help your health care provider understand your overall risk for other conditions. How is this treated? This condition is treated by making healthy lifestyle changes, such as eating healthy foods, exercising more, and reducing your alcohol intake. Your health care provider may prescribe medicine if lifestyle changes are not enough to get your blood pressure under control, and if:  Your systolic blood pressure is above 130.  Your diastolic blood pressure is above 80.  Your personal target blood pressure may vary depending on your medical conditions, your age, and other factors. Follow these instructions at home: Eating and drinking  Eat a diet that is high in fiber and potassium, and low in sodium, added sugar, and fat. An example eating plan is called the DASH (Dietary Approaches to Stop Hypertension) diet. To eat this way: ? Eat plenty of fresh fruits and vegetables. Try to fill half of your plate at each meal with fruits and vegetables. ? Eat whole grains, such as whole wheat pasta, brown rice, or whole grain bread. Fill about one quarter of your plate with whole grains. ? Eat or drink low-fat dairy products, such as skim milk or low-fat yogurt. ? Avoid fatty cuts of meat, processed or cured meats, and poultry with skin. Fill about one quarter of  your plate with lean proteins, such as fish, chicken without skin, beans, eggs, and tofu. ? Avoid premade and processed foods. These tend to be higher in sodium, added sugar, and fat.  Reduce your daily sodium intake. Most people with hypertension should eat less than 1,500 mg of sodium a day.  Limit alcohol intake to no more than 1 drink a day for nonpregnant women and 2 drinks a day for men. One drink equals 12 oz of beer, 5 oz of wine, or 1 oz of hard liquor. Lifestyle  Work with your health care provider to maintain a healthy body weight or to lose weight. Ask what an ideal weight is for you.  Get at least 30 minutes of exercise that causes your heart to beat faster (aerobic exercise) most days of the week. Activities may include walking, swimming, or biking.  Include exercise to strengthen your muscles (resistance exercise), such as pilates or lifting weights, as part of your weekly exercise routine. Try to do these types of exercises for 30 minutes at least 3 days a week.  Do not use any products that contain nicotine or tobacco, such as cigarettes and e-cigarettes. If you need help quitting, ask your health care provider.  Monitor your blood pressure at home as told by your health care provider.  Keep all follow-up visits as told by your health care provider. This is important. Medicines  Take over-the-counter and prescription medicines only as told by your health care provider. Follow directions carefully. Blood pressure medicines must be taken as prescribed.  Do not skip doses of blood pressure medicine. Doing this puts you at risk for problems and can make the medicine less effective.  Ask your health care provider about side effects or reactions to medicines that you should watch for. Contact a health care provider if:  You think you are having a reaction to a medicine you are taking.  You have headaches that keep coming back (recurring).  You feel dizzy.  You have  swelling in your ankles.  You have trouble with your vision. Get help right away if:  You develop a severe headache or confusion.  You have unusual weakness or numbness.  You feel faint.  You have severe pain in your chest or abdomen.  You vomit repeatedly.  You have trouble breathing. Summary  Hypertension is when the force of blood pumping through your arteries is too strong. If this condition is not controlled, it may put you at risk for serious complications.  Your personal target blood pressure may vary depending on your medical conditions, your age, and other factors. For most people, a normal blood pressure is less than 120/80.  Hypertension is treated with lifestyle changes, medicines, or a combination of both. Lifestyle changes include weight loss, eating a healthy, low-sodium diet, exercising more, and limiting alcohol. This information is not intended to replace advice given to you by your health care provider. Make sure you discuss any questions you have with your health care provider. Document Released: 07/26/2005 Document Revised: 06/23/2016 Document Reviewed: 06/23/2016 Elsevier Interactive Patient Education  2018 Elsevier Inc.     Agustina Caroli, MD [facility]

## 2017-06-22 NOTE — Assessment & Plan Note (Signed)
Doing well; will change from Losartan to Lisinopril.

## 2018-02-06 ENCOUNTER — Other Ambulatory Visit: Payer: Self-pay

## 2018-02-06 ENCOUNTER — Ambulatory Visit (INDEPENDENT_AMBULATORY_CARE_PROVIDER_SITE_OTHER): Payer: Medicare Other | Admitting: Physician Assistant

## 2018-02-06 ENCOUNTER — Encounter: Payer: Self-pay | Admitting: Physician Assistant

## 2018-02-06 VITALS — BP 114/64 | HR 74 | Temp 98.4°F | Resp 18 | Ht 66.61 in | Wt 180.6 lb

## 2018-02-06 DIAGNOSIS — H6123 Impacted cerumen, bilateral: Secondary | ICD-10-CM | POA: Diagnosis not present

## 2018-02-06 NOTE — Patient Instructions (Addendum)
  Earwax Buildup, Adult The ears produce a substance called earwax that helps keep bacteria out of the ear and protects the skin in the ear canal. Occasionally, earwax can build up in the ear and cause discomfort or hearing loss. What increases the risk? This condition is more likely to develop in people who:  Are male.  Are elderly.  Naturally produce more earwax.  Clean their ears often with cotton swabs.  Use earplugs often.  Use in-ear headphones often.  Wear hearing aids.  Have narrow ear canals.  Have earwax that is overly thick or sticky.  Have eczema.  Are dehydrated.  Have excess hair in the ear canal.  What are the signs or symptoms? Symptoms of this condition include:  Reduced or muffled hearing.  A feeling of fullness in the ear or feeling that the ear is plugged.  Fluid coming from the ear.  Ear pain.  Ear itch.  Ringing in the ear.  Coughing.  An obvious piece of earwax that can be seen inside the ear canal.  How is this diagnosed? This condition may be diagnosed based on:  Your symptoms.  Your medical history.  An ear exam. During the exam, your health care provider will look into your ear with an instrument called an otoscope.  You may have tests, including a hearing test. How is this treated? This condition may be treated by:  Using ear drops to soften the earwax.  Having the earwax removed by a health care provider. The health care provider may: ? Flush the ear with water. ? Use an instrument that has a loop on the end (curette). ? Use a suction device.  Surgery to remove the wax buildup. This may be done in severe cases.  Follow these instructions at home:  Take over-the-counter and prescription medicines only as told by your health care provider.  Do not put any objects, including cotton swabs, into your ear. You can clean the opening of your ear canal with a washcloth or facial tissue.  Follow instructions from your  health care provider about cleaning your ears. Do not over-clean your ears.  Drink enough fluid to keep your urine clear or pale yellow. This will help to thin the earwax.  Keep all follow-up visits as told by your health care provider. If earwax builds up in your ears often or if you use hearing aids, consider seeing your health care provider for routine, preventive ear cleanings. Ask your health care provider how often you should schedule your cleanings.  If you have hearing aids, clean them according to instructions from the manufacturer and your health care provider. Contact a health care provider if:  You have ear pain.  You develop a fever.  You have blood, pus, or other fluid coming from your ear.  You have hearing loss.  You have ringing in your ears that does not go away.  Your symptoms do not improve with treatment.  You feel like the room is spinning (vertigo). Summary  Earwax can build up in the ear and cause discomfort or hearing loss.  The most common symptoms of this condition include reduced or muffled hearing and a feeling of fullness in the ear or feeling that the ear is plugged.  This condition may be diagnosed based on your symptoms, your medical history, and an ear exam.  This condition may be treated by using ear drops to soften the earwax or by having the earwax removed by a health care provider.    Do not put any objects, including cotton swabs, into your ear. You can clean the opening of your ear canal with a washcloth or facial tissue. This information is not intended to replace advice given to you by your health care provider. Make sure you discuss any questions you have with your health care provider. Document Released: 09/02/2004 Document Revised: 10/06/2016 Document Reviewed: 10/06/2016 Elsevier Interactive Patient Education  2018 Elsevier Inc.   IF you received an x-ray today, you will receive an invoice from [facility]. Please contact  [facility] at [phone] with questions or concerns regarding your invoice.   IF you received labwork today, you will receive an invoice from [facility]. Please contact [facility] at [phone] with questions or concerns regarding your invoice.   Our billing staff will not be able to assist you with questions regarding bills from these companies.  You will be contacted with the lab results as soon as they are available. The fastest way to get your results is to activate your My Chart account. Instructions are located on the last page of this paperwork. If you have not heard from us regarding the results in 2 weeks, please contact this office.     

## 2018-02-06 NOTE — Progress Notes (Signed)
Clarence Sanders  MRN: [id number] DOB: 1946-06-15  Subjective:  Clarence Sanders is a 72 y.o. male seen in office today for a chief complaint of right ear pain x5 days.  Notes it feels full.  Has associated decreased hearing. Denies ear itching, ear drainage, tinnitus, dizziness, nasal congestion, sore throat, and sinus pain.  Denies left ear pain. Has tried over-the-counter eardrops with no full relief.  Denies chronic use of Q-tips or ear buds.  Has had this issue in the past and had to have them cleaned out.  PMH of seasonal allergies.  No other questions or concerns today.  Review of Systems  Constitutional: Negative for chills, diaphoresis and fever.  Gastrointestinal: Negative for nausea and vomiting.    Patient Active Problem List   Diagnosis Date Noted  . Routine general medical examination at a health care facility 11/15/2016  . Mild intermittent asthma without complication 08/65/7846  . Barrett's esophagus without dysplasia 03/04/2016  . Gastroesophageal reflux disease 08/12/2015  . Benign essential hypertension 08/12/2015  . Varicose veins of lower extremities with other complications 96/29/5284  . ASTHMA 11/14/2007  . OSTEOARTHRITIS 11/14/2007  . COLONIC POLYPS 07/21/2007  . Hypothyroidism (acquired) 07/21/2007  . HYPOGONADISM, MALE 07/21/2007  . HYPERLIPIDEMIA 07/21/2007  . VARICOCELE 07/21/2007  . 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 Fluticasone-Salmeterol (ADVAIR) 100-50 MCG/DOSE AEPB Inhale 1 puff into the lungs 2 (two) times daily. 1 each 3  . levothyroxine (SYNTHROID, LEVOTHROID) 150 MCG tablet Take 1 tablet (150 mcg total) by mouth daily. 90 tablet 3  . lisinopril (PRINIVIL,ZESTRIL) 20 MG tablet Take 1 tablet (20 mg total) daily by mouth. 90 tablet 3  . amLODipine (NORVASC) 5 MG tablet Take 1 tablet (5 mg total) by mouth daily. 90 tablet 3  . esomeprazole  (NEXIUM) 40 MG capsule Take 1 capsule (40 mg total) by mouth daily at 12 noon. 90 capsule 3   No current facility-administered medications on file prior to visit.     Allergies  Allergen Reactions  . Aspirin Anaphylaxis    Angioedema,   . Penicillins Anaphylaxis  . Ketorolac Tromethamine      Objective:  BP 114/64 (BP Location: Left Arm, Patient Position: Sitting, Cuff Size: Normal)   Pulse 74   Temp 98.4 F (36.9 C) (Oral)   Resp 18   Ht 5' 6.61" (1.692 m)   Wt 180 lb 9.6 oz (81.9 kg)   SpO2 94%   BMI 28.61 kg/m   Physical Exam  Constitutional: He is oriented to person, place, and time. He appears well-developed and well-nourished. No distress.  HENT:  Head: Normocephalic and atraumatic.  Bilateral ear canals impacted with copious amount of dark brown dry cerumen, unable to visualize TMs.   Post bilateral ear lavage, ear canals are clear of cerumen.  Mild irritation of right ear canal noted.  TMs are visualized and are normal in appearance.  No erythema or bulging of TM noted.   Eyes: Conjunctivae are normal.  Neck: Normal range of motion.  Pulmonary/Chest: Effort normal.  Neurological: He is alert and oriented to person, place, and time.  Skin: Skin is warm and dry.  Psychiatric: He has a normal mood and affect.  Vitals reviewed.   Assessment and Plan :  1. Bilateral impacted cerumen Resolved. Pt reports feeling much relief. Follow up as needed.  - Ear Lavage   Tenna Delaine PA-C  [facility] 02/06/2018 6:05 PM

## 2018-04-26 ENCOUNTER — Ambulatory Visit (INDEPENDENT_AMBULATORY_CARE_PROVIDER_SITE_OTHER): Payer: Medicare Other | Admitting: Urgent Care

## 2018-04-26 ENCOUNTER — Encounter: Payer: Self-pay | Admitting: Urgent Care

## 2018-04-26 ENCOUNTER — Other Ambulatory Visit: Payer: Self-pay

## 2018-04-26 VITALS — BP 124/70 | HR 72 | Temp 98.4°F | Resp 18 | Ht 66.61 in | Wt 181.0 lb

## 2018-04-26 DIAGNOSIS — L299 Pruritus, unspecified: Secondary | ICD-10-CM

## 2018-04-26 DIAGNOSIS — L989 Disorder of the skin and subcutaneous tissue, unspecified: Secondary | ICD-10-CM

## 2018-04-26 MED ORDER — TRIAMCINOLONE ACETONIDE 0.1 % EX CREA: 1.0000 "application " | TOPICAL_CREAM | Freq: Two times a day (BID) | CUTANEOUS | 0 refills | Status: DC

## 2018-04-26 NOTE — Progress Notes (Signed)
    MRN: [id number] DOB: [birth year]  Subjective:   Clarence Sanders is a 72 y.o. male presenting for 10-day history of facial skin lesion that is very itchy.  Patient has been scratching incessantly, reports that he feels it is irritated now from all the scratching.  He has not used any medications for relief.  Denies bleeding, redness or swelling, pain.  Denies history of skin cancer, trauma, insect bite.  Clarence Sanders has a current medication list which includes the following prescription(s): fluticasone-salmeterol, levothyroxine, lisinopril, amlodipine, and esomeprazole. Also is allergic to aspirin; penicillins; and ketorolac tromethamine.  Clarence Sanders  has a past medical history of Allergy, Hypertension, Hypogonadism male, Thyroid disease, and Varicose veins. Also  has a past surgical history that includes Varicose vein surgery.  Objective:   Vitals: BP 124/70   Pulse 72   Temp 98.4 F (36.9 C) (Oral)   Resp 18   Ht 5' 6.61" (1.692 m)   Wt 181 lb (82.1 kg)   SpO2 97%   BMI 28.68 kg/m   BP Readings from Last 3 Encounters:  04/26/18 124/70  02/06/18 114/64  06/22/17 118/74   Physical Exam  HENT:  Head:        Assessment and Plan :   Facial skin lesion - Plan: Ambulatory referral to Dermatology  Itching   Will use a lower dose of triamcinolone cream for his itching and inflammation.  Counseled that he needs to have a skin biopsy to rule out malignant skin lesion such as basal cell carcinoma.  Referral is pending. Counseled patient on potential for adverse effects with medications prescribed today, patient verbalized understanding. Return-to-clinic precautions discussed, patient verbalized understanding.   Jaynee Eagles, PA-C [facility] at [facility] [phone] 04/26/2018  3:12 PM

## 2018-04-26 NOTE — Patient Instructions (Addendum)
Escisin de lesiones en la piel (Excision of Skin Lesions) La escisin de una lesin en la piel consiste en extirpar una porcin de piel mediante pequeos cortes (incisiones). Se puede realizar este procedimiento para extirpar un crecimiento cutneo canceroso (maligno) o no canceroso (benigno). A menudo se hace para tratar o prevenir un cncer o una infeccin, y, tambin, para mejorar el aspecto esttico. El procedimiento se puede realizar para extirpar lo siguiente:  Crecimientos cancerosos, como un carcinoma basocelular, un carcinoma de clulas escamosas o un melanoma.  Crecimientos no cancerosos, como un quiste o un lipoma.  Crecimientos, como lunares o papilomas cutneos, que se pueden extirpar por motivos estticos. Se pueden usar diferentes tcnicas quirrgicas o de escisin en funcin de la afeccin, TEFL teacher de la lesin y su estado de salud general. Catha Nottingham A SU MDICO:  Cualquier alergia que tenga.  Todos los [employer], incluidos vitaminas, hierbas, gotas oftlmicas, cremas y medicamentos de venta libre.  Problemas previos que usted o los [employer] de su familia hayan tenido con el uso de anestsicos.  Enfermedades de la sangre que tenga.  Si tiene cirugas previas.  Cualquier enfermedad que tenga.  Si est embarazada o podra estarlo. RIESGOS Y COMPLICACIONES En general, se trata de un procedimiento seguro. Sin embargo, pueden presentarse problemas, por ejemplo:  Hemorragia.  Infeccin.  Formacin de cicatrices.  Recurrencia del quiste, el lipoma o el cncer.  Cambios en el aspecto o la sensibilidad de la piel, como cambio de color o hinchazn.  Reaccin a la anestesia.  Reaccin alrgica a los ungentos o materiales quirrgicos.  D[employer] nervios, los vasos sanguneos, los msculos u otras estructuras.  Dolor continuo. ANTES DEL PROCEDIMIENTO  Consulte a su mdico acerca de estos temas: ? Cambiar o suspender los medicamentos que toma  habitualmente. Esto es muy importante si toma medicamentos para la diabetes o anticoagulantes. ? Tomar medicamentos, como aspirina e ibuprofeno. Estos medicamentos pueden tener un efecto anticoagulante en la [place]. No tome estos medicamentos antes del procedimiento si el mdico le indica que no lo haga.  Pueden indicarle que tome determinados medicamentos.  Pueden pedirle que deje de fumar.  Pueden hacerle un examen o estudios.  Haga planes para que una persona lo lleve de vuelta a su casa despus del procedimiento.  Pdale a alguna persona que lo ayude con sus actividades mientras se recupera. PROCEDIMIENTO  Para reducir el riesgo de infecciones: ? El equipo mdico se lavar o se desinfectar las manos. ? Le lavarn la piel con jabn.  Le administrarn un medicamento para adormecer la zona (anestesia local).  Se usar una de las siguientes tcnicas de escisin.  Al finalizar cualquiera de estos procedimientos, se aplicar un ungento con antibitico, segn sea necesario. Cada una de las tcnicas que se describen a continuacin puede variar segn el mdico y el hospital. Escisin quirrgica completa Se marcar con un bolgrafo la zona de la piel que se debe extirpar. Con un escalpelo pequeo o con tijeras, el cirujano cortar con cuidado la zona que rodea y que est por debajo de la lesin hasta extirparla por completo. La lesin se Soil scientist un lquido y se enviar al laboratorio para ser examinada. Si es necesario, la hemorragia se Chief Technology Officer con un dispositivo que Biochemist, clinical (Financial trader). Se pueden cerrar los bordes de la herida con puntos (suturas), y se Engineer, production venda (vendaje). Este procedimiento puede hacerse para tratar un crecimiento canceroso, o una lesin o un quiste no cancerosos. Escisin de un quiste  El cirujano har una incisin en el quiste para retirarlo por completo a travs de esta. La incisin se puede cerrar con suturas. Escisin por [id number] Durante  la escisin por rasurado, el cirujano usar una pequea navaja o un instrumento tipo asa que se calienta con energa elctrica para recortar la lesin. Esto se puede hacer para extirpar un lunar o un papiloma cutneo. En general, se dejar que la herida cicatrice sola, sin suturas. Escisin en sacabocados Durante la escisin en sacabocados, el cirujano usar una pequea herramienta parecida a un cortador de Administrator o a una perforadora para recortar un crculo en la piel y extirparlo. Se cerrarn los bordes externos de la piel con suturas. Esto puede hacerse para extirpar un lunar o una Radio broadcast assistant, o para hacer una biopsia de la lesin. Ciruga microgrfica de Mohs Durante la ciruga microgrfica de Mohs, se extirparn capas de la lesin con un escalpelo o un instrumento tipo asa, y se las examinar de inmediato con un microscopio. Se retirarn capas hasta que se haya eliminado todo el tejido anormal o canceroso. Este procedimiento es mnimamente invasivo y garantiza el mejor resultado esttico, y requiere Scientist, research (life sciences) extirpacin posible de tejido normal. La ciruga de Mohs por lo general se hace para tratar el cncer de piel, como el carcinoma basocelular o el carcinoma de clulas escamosas, en especial en la cara y las orejas. En funcin del tamao de la herida [place], se la puede cerrar con suturas. DESPUS DEL PROCEDIMIENTO  Reanude sus actividades normales como se lo haya indicado el mdico.  Hable con el mdico para analizar los [employer], las opciones de tratamiento y, si fuese necesario, la necesidad de Optometrist ms [place]. Esta informacin no tiene Marine scientist el consejo del mdico. Asegrese de hacerle al mdico cualquier pregunta que tenga. Document Released: 05/16/2013 Document Revised: 04/16/2015 Document Reviewed: 09/11/2014 Elsevier Interactive Patient Education  [employer].     If you have lab work done today you will be contacted with your lab  results within the next 2 weeks.  If you have not heard from [place] then please contact us. The fastest way to get your results is to register for My Chart.   IF you received an x-ray today, you will receive an invoice from [facility]. Please contact [facility] at [phone] with questions or concerns regarding your invoice.   IF you received labwork today, you will receive an invoice from [facility]. Please contact [facility] at [phone] with questions or concerns regarding your invoice.   Our billing staff will not be able to assist you with questions regarding bills from these companies.  You will be contacted with the lab results as soon as they are available. The fastest way to get your results is to activate your My Chart account. Instructions are located on the last page of this paperwork. If you have not heard from [place] regarding the results in 2 weeks, please contact this office.

## 2020-02-13 ENCOUNTER — Encounter: Payer: Self-pay | Admitting: Family Medicine

## 2020-02-13 ENCOUNTER — Other Ambulatory Visit: Payer: Self-pay

## 2020-02-13 ENCOUNTER — Ambulatory Visit (INDEPENDENT_AMBULATORY_CARE_PROVIDER_SITE_OTHER): Payer: Medicare Other | Admitting: Family Medicine

## 2020-02-13 VITALS — BP 130/80 | HR 84 | Temp 98.0°F | Resp 12 | Ht 66.61 in | Wt 181.0 lb

## 2020-02-13 DIAGNOSIS — I10 Essential (primary) hypertension: Secondary | ICD-10-CM | POA: Diagnosis not present

## 2020-02-13 DIAGNOSIS — G4700 Insomnia, unspecified: Secondary | ICD-10-CM

## 2020-02-13 DIAGNOSIS — F419 Anxiety disorder, unspecified: Secondary | ICD-10-CM

## 2020-02-13 DIAGNOSIS — E039 Hypothyroidism, unspecified: Secondary | ICD-10-CM | POA: Diagnosis not present

## 2020-02-13 MED ORDER — TRAZODONE HCL 50 MG PO TABS: 25.0000 mg | ORAL_TABLET | Freq: Every day | ORAL | 0 refills | Status: DC

## 2020-02-13 NOTE — Assessment & Plan Note (Addendum)
We will plan on checking TSH next visit. Continue Levothyroxine 150 mcg daily.

## 2020-02-13 NOTE — Assessment & Plan Note (Signed)
BP adequately controlled. No changes in current management. Continue low salt diet. 

## 2020-02-13 NOTE — Progress Notes (Signed)
HPI: [name] is a 74 y.o. male, who is here today to establish care.  Former PCP: Dr Karle Starch. Last preventive routine visit: 03/2018.  Chronic medical problems: OA,asthma,GERD,HTN,HLD,hyporthyroidism,and tinnitus among some.  He had labs early this years.  HTN: Dx'ed at age 29. He is on Amlodipine-Olmesartan 5-40 mg daily. Negative for severe/frequent headache, visual changes, chest pain, dyspnea, palpitation, claudication, focal weakness, or edema. + OSA.  GERD: He is on Nexium 40 mg daily. He takes medication daily. Helps with heartburn and acid reflux.  Negative for abdominal pain, nausea, vomiting, changes in bowel habits, blood in stool or melena.  HLD: He is on non pharmacologic treatment.  Lab Results  Component Value Date   CHOL 211 (H) 11/15/2016   HDL 60 11/15/2016   LDLCALC 137 (H) 11/15/2016   TRIG 72 11/15/2016   CHOLHDL 3.5 11/15/2016   Hypothyroidism: Taking Levothyroxine 150 mg daily. He has not noted wt loss ,tremore,cold/heat intolerance.  Asthma: Exacerbated by seasonal changes and exercise. He uses Symbicort prn.  Concerns today: Insomnia.  Wakes up a few times through the night, 2-3 times. He has tried Melatonin low dose. Irritable and anxious in the morning when he first gets up, alleviated by exercise. He has not tried anxiolytic medications in the past. Negative for depressed mood.  GAD 7 : Generalized Anxiety Score 02/13/2020  Nervous, Anxious, on Edge 1  Control/stop worrying 3  Worry too much - different things 2  Trouble relaxing 3  Restless 1  Easily annoyed or irritable 2  Afraid - awful might happen 2  Total GAD 7 Score 14  Anxiety Difficulty Somewhat difficult    Review of Systems  Constitutional: Positive for fatigue. Negative for activity change, appetite change and fever.  HENT: Negative for mouth sores, nosebleeds and sore throat.   Respiratory: Negative for cough and wheezing.   Genitourinary:  Negative for decreased urine volume, dysuria and hematuria.  Musculoskeletal: Positive for arthralgias. Negative for gait problem.  Skin: Negative for rash and wound.  Allergic/Immunologic: Positive for environmental allergies.  Neurological: Negative for syncope, facial asymmetry and weakness.  Psychiatric/Behavioral: Negative for confusion. The patient is nervous/anxious.   Rest see pertinent positives and negatives per HPI.  Current Outpatient Medications on File Prior to Visit  Medication Sig Dispense Refill  . amLODipine-olmesartan (AZOR) 5-40 MG tablet Take 1 tablet by mouth daily.    . budesonide-formoterol (SYMBICORT) 160-4.5 MCG/ACT inhaler Inhale 2 puffs into the lungs 2 (two) times daily.    Marland Kitchen esomeprazole (NEXIUM) 40 MG capsule Take 40 mg by mouth daily at 12 noon.    Marland Kitchen levothyroxine (SYNTHROID, LEVOTHROID) 150 MCG tablet Take 1 tablet (150 mcg total) by mouth daily. 90 tablet 3   No current facility-administered medications on file prior to visit.   Past Medical History:  Diagnosis Date  . Allergy   . Arthritis   . Asthma   . Hypertension   . Hypogonadism male   . Thyroid disease    Hypothyroidism  . Varicose veins    Allergies  Allergen Reactions  . Aspirin Anaphylaxis    Angioedema,   . Penicillins Anaphylaxis  . Ketorolac Tromethamine     Family History  Problem Relation Age of Onset  . Other Mother        varicose veins  . Arthritis Mother   . Asthma Mother   . Heart disease Father        before age 56  . Heart attack Father   .  Cancer Sister   . Cancer Brother   . Arthritis Paternal Grandmother     Social History   Socioeconomic History  . Marital status: Married    Spouse name: Not on file  . Number of children: 2  . Years of education: Not on file  . Highest education level: Not on file  Occupational History  . Not on file  Tobacco Use  . Smoking status: Never Smoker  . Smokeless tobacco: Never Used  Vaping Use  . Vaping Use: Never  used  Substance and Sexual Activity  . Alcohol use: Yes    Alcohol/week: 9.0 standard drinks    Types: 5 Cans of beer, 4 Shots of liquor per week  . Drug use: No  . Sexual activity: Not Currently  Other Topics Concern  . Not on file  Social History Narrative  . Not on file   Social Determinants of Health   Financial Resource Strain:   . Difficulty of Paying Living Expenses:   Food Insecurity:   . Worried About Charity fundraiser in the Last Year:   . Arboriculturist in the Last Year:   Transportation Needs:   . Film/video editor (Medical):   Marland Kitchen Lack of Transportation (Non-Medical):   Physical Activity:   . Days of Exercise per Week:   . Minutes of Exercise per Session:   Stress:   . Feeling of Stress :   Social Connections:   . Frequency of Communication with Friends and Family:   . Frequency of Social Gatherings with Friends and Family:   . Attends Religious Services:   . Active Member of Clubs or Organizations:   . Attends Archivist Meetings:   Marland Kitchen Marital Status:     Vitals:   02/13/20 0758  BP: 130/80  Pulse: 84  Resp: 12  Temp: 98 F (36.7 C)  SpO2: 94%   Body mass index is 28.68 kg/m.  Physical Exam Nursing note reviewed.  Constitutional:      General: He is not in acute distress.    Appearance: He is well-developed.  HENT:     Head: Normocephalic and atraumatic.     Mouth/Throat:     Mouth: Mucous membranes are moist.     Pharynx: Oropharynx is clear.  Eyes:     Conjunctiva/sclera: Conjunctivae normal.     Pupils: Pupils are equal, round, and reactive to light.  Cardiovascular:     Rate and Rhythm: Normal rate and regular rhythm.     Pulses:          Dorsalis pedis pulses are 2+ on the right side and 2+ on the left side.     Heart sounds: No murmur heard.   Pulmonary:     Effort: Pulmonary effort is normal. No respiratory distress.     Breath sounds: Normal breath sounds.  Abdominal:     Palpations: Abdomen is soft. There is  no hepatomegaly or mass.     Tenderness: There is no abdominal tenderness.  Lymphadenopathy:     Cervical: No cervical adenopathy.  Skin:    General: Skin is warm.     Findings: No erythema or rash.  Neurological:     General: No focal deficit present.     Mental Status: He is alert and oriented to person, place, and time.     Cranial Nerves: No cranial nerve deficit.     Gait: Gait normal.  Psychiatric:        Mood  and Affect: Affect normal. Mood is anxious.     Comments: Well groomed, good eye contact.    ASSESSMENT AND PLAN:  Clarence Sanders was seen today for establish care.  Diagnoses and all orders for this visit:  Anxiety disorder, unspecified type Trazodone started today. Some side effects discussed. F/U in 6 months,before if needed.  -     traZODone (DESYREL) 50 MG tablet; Take 0.5 tablets (25 mg total) by mouth at bedtime.  Insomnia, unspecified type After discussion of some side effects he agrees with taking Trazodone, start with 25 mg and increase to 50 mg as tolerated. Good sleep hygiene.  -     traZODone (DESYREL) 50 MG tablet; Take 0.5 tablets (25 mg total) by mouth at bedtime.  Benign essential hypertension BP adequately controlled. No changes in current management. Continue low salt diet.   Hypothyroidism (acquired) We will plan on checking TSH next visit. Continue Levothyroxine 150 mcg daily.   Return in about 6 weeks (around 03/26/2020) for Anxiety and insomnia..   Clarence Reyez G. [place], MD  [facility]. [place] office.

## 2020-02-13 NOTE — Patient Instructions (Signed)
A few things to remember from today's visit:   Benign essential hypertension  Anxiety disorder, unspecified type - Plan: traZODone (DESYREL) 50 MG tablet  Insomnia, unspecified type - Plan: traZODone (DESYREL) 50 MG tablet  Hoy vamos a empezar Trazodone para ayudarle a dormir y para anxiedad.  Empieze con 1/2 tab 30-45 min antes de irse a la came.  If you need refills please call your pharmacy. Do not use My Chart to request refills or for acute issues that need immediate attention.    Please be sure medication list is accurate. If a new problem present, please set up appointment sooner than planned today.

## 2020-03-30 NOTE — Progress Notes (Deleted)
HPI:   [name] is a 74 y.o. male, who is here today for *** months follow up.   He was last seen on 02/13/20. Trazodone 50 mg 1/2 tab was added last visit. *** Lab Results  Component Value Date   CHOL 211 (H) 11/15/2016   HDL 60 11/15/2016   LDLCALC 137 (H) 11/15/2016   TRIG 72 11/15/2016   CHOLHDL 3.5 11/15/2016   Lab Results  Component Value Date   TSH 2.880 11/15/2016    Lab Results  Component Value Date   CREATININE 0.82 11/15/2016   BUN 19 11/15/2016   NA 144 11/15/2016   K 4.4 11/15/2016   CL 101 11/15/2016   CO2 23 11/15/2016    {4+ HPI elements (or status of 3 or more chronic diseases)} ***   Review of Systems Rest of ROS, see pertinent positives sand negatives in HPI [review of 2 to 9 systems] ***  Current Outpatient Medications on File Prior to Visit  Medication Sig Dispense Refill  . amLODipine-olmesartan (AZOR) 5-40 MG tablet Take 1 tablet by mouth daily.    . budesonide-formoterol (SYMBICORT) 160-4.5 MCG/ACT inhaler Inhale 2 puffs into the lungs 2 (two) times daily.    Marland Kitchen esomeprazole (NEXIUM) 40 MG capsule Take 40 mg by mouth daily at 12 noon.    Marland Kitchen levothyroxine (SYNTHROID, LEVOTHROID) 150 MCG tablet Take 1 tablet (150 mcg total) by mouth daily. 90 tablet 3  . traZODone (DESYREL) 50 MG tablet Take 0.5 tablets (25 mg total) by mouth at bedtime. 30 tablet 0   No current facility-administered medications on file prior to visit.     Past Medical History:  Diagnosis Date  . Allergy   . Arthritis   . Asthma   . Hypertension   . Hypogonadism male   . Thyroid disease    Hypothyroidism  . Varicose veins    Allergies  Allergen Reactions  . Aspirin Anaphylaxis    Angioedema,   . Penicillins Anaphylaxis  . Ketorolac Tromethamine     Social History   Socioeconomic History  . Marital status: Married    Spouse name: Not on file  . Number of children: 2  . Years of education: Not on file  . Highest education level: Not  on file  Occupational History  . Not on file  Tobacco Use  . Smoking status: Never Smoker  . Smokeless tobacco: Never Used  Vaping Use  . Vaping Use: Never used  Substance and Sexual Activity  . Alcohol use: Yes    Alcohol/week: 9.0 standard drinks    Types: 5 Cans of beer, 4 Shots of liquor per week  . Drug use: No  . Sexual activity: Not Currently  Other Topics Concern  . Not on file  Social History Narrative  . Not on file   Social Determinants of Health   Financial Resource Strain:   . Difficulty of Paying Living Expenses: Not on file  Food Insecurity:   . Worried About Charity fundraiser in the Last Year: Not on file  . Ran Out of Food in the Last Year: Not on file  Transportation Needs:   . Lack of Transportation (Medical): Not on file  . Lack of Transportation (Non-Medical): Not on file  Physical Activity:   . Days of Exercise per Week: Not on file  . Minutes of Exercise per Session: Not on file  Stress:   . Feeling of Stress : Not on file  Social  Connections:   . Frequency of Communication with Friends and Family: Not on file  . Frequency of Social Gatherings with Friends and Family: Not on file  . Attends Religious Services: Not on file  . Active Member of Clubs or Organizations: Not on file  . Attends Archivist Meetings: Not on file  . Marital Status: Not on file    There were no vitals filed for this visit. There is no height or weight on file to calculate BMI.   Physical Exam  {[12+ exam elements]} ***  ASSESSMENT AND PLAN:   [name] was seen today for *** months follow-up.  No orders of the defined types were placed in this encounter.   No problem-specific Assessment & Plan notes found for this encounter.    No follow-ups on file.        -[name] was advised to return sooner than planned today if new concerns arise.       Larena Ohnemus G. [place], MD  [facility]. [place]  office.

## 2020-03-31 ENCOUNTER — Ambulatory Visit: Payer: Medicare Other | Admitting: Family Medicine

## 2020-03-31 DIAGNOSIS — Z0289 Encounter for other administrative examinations: Secondary | ICD-10-CM

## 2021-05-06 ENCOUNTER — Ambulatory Visit: Payer: Medicare Other | Admitting: Family Medicine

## 2021-05-13 ENCOUNTER — Other Ambulatory Visit: Payer: Self-pay

## 2021-05-13 ENCOUNTER — Encounter: Payer: Self-pay | Admitting: Family Medicine

## 2021-05-13 ENCOUNTER — Ambulatory Visit (INDEPENDENT_AMBULATORY_CARE_PROVIDER_SITE_OTHER): Payer: Medicare Other | Admitting: Family Medicine

## 2021-05-13 VITALS — BP 130/76 | HR 74 | Resp 16 | Ht 66.61 in | Wt 182.0 lb

## 2021-05-13 DIAGNOSIS — J4520 Mild intermittent asthma, uncomplicated: Secondary | ICD-10-CM

## 2021-05-13 DIAGNOSIS — R7303 Prediabetes: Secondary | ICD-10-CM

## 2021-05-13 DIAGNOSIS — I10 Essential (primary) hypertension: Secondary | ICD-10-CM | POA: Diagnosis not present

## 2021-05-13 DIAGNOSIS — Z0000 Encounter for general adult medical examination without abnormal findings: Secondary | ICD-10-CM | POA: Diagnosis not present

## 2021-05-13 DIAGNOSIS — Z23 Encounter for immunization: Secondary | ICD-10-CM | POA: Diagnosis not present

## 2021-05-13 DIAGNOSIS — R351 Nocturia: Secondary | ICD-10-CM | POA: Diagnosis not present

## 2021-05-13 DIAGNOSIS — Z136 Encounter for screening for cardiovascular disorders: Secondary | ICD-10-CM

## 2021-05-13 DIAGNOSIS — N401 Enlarged prostate with lower urinary tract symptoms: Secondary | ICD-10-CM

## 2021-05-13 DIAGNOSIS — E039 Hypothyroidism, unspecified: Secondary | ICD-10-CM

## 2021-05-13 DIAGNOSIS — E785 Hyperlipidemia, unspecified: Secondary | ICD-10-CM

## 2021-05-13 DIAGNOSIS — Z1211 Encounter for screening for malignant neoplasm of colon: Secondary | ICD-10-CM

## 2021-05-13 DIAGNOSIS — K219 Gastro-esophageal reflux disease without esophagitis: Secondary | ICD-10-CM

## 2021-05-13 LAB — COMPREHENSIVE METABOLIC PANEL
ALT: 24 U/L (ref 0–53)
AST: 23 U/L (ref 0–37)
Albumin: 4.5 g/dL (ref 3.5–5.2)
Alkaline Phosphatase: 55 U/L (ref 39–117)
BUN: 17 mg/dL (ref 6–23)
CO2: 27 mEq/L (ref 19–32)
Calcium: 9.4 mg/dL (ref 8.4–10.5)
Chloride: 102 mEq/L (ref 96–112)
Creatinine, Ser: 0.84 mg/dL (ref 0.40–1.50)
GFR: 85.79 mL/min (ref >60.00)
Glucose, Bld: 92 mg/dL (ref 70–99)
Potassium: 4.3 mEq/L (ref 3.5–5.1)
Sodium: 138 mEq/L (ref 135–145)
Total Bilirubin: 0.6 mg/dL (ref 0.2–1.2)
Total Protein: 7 g/dL (ref 6.0–8.3)

## 2021-05-13 LAB — LIPID PANEL
Cholesterol: 204 mg/dL — ABNORMAL HIGH (ref 0–200)
HDL: 60.5 mg/dL (ref >39.00)
LDL Cholesterol: 124 mg/dL — ABNORMAL HIGH (ref 0–99)
NonHDL: 143.09
Total CHOL/HDL Ratio: 3
Triglycerides: 93 mg/dL (ref 0.0–149.0)
VLDL: 18.6 mg/dL (ref 0.0–40.0)

## 2021-05-13 LAB — TSH: TSH: 0.72 u[IU]/mL (ref 0.35–5.50)

## 2021-05-13 LAB — HEMOGLOBIN A1C: Hgb A1c MFr Bld: 6 % (ref 4.6–6.5)

## 2021-05-13 LAB — PSA: PSA: 3.3 ng/mL (ref 0.10–4.00)

## 2021-05-13 LAB — T3, FREE: T3, Free: 2.9 pg/mL (ref 2.3–4.2)

## 2021-05-13 LAB — T4, FREE: Free T4: 1.08 ng/dL (ref 0.60–1.60)

## 2021-05-13 MED ORDER — BUDESONIDE-FORMOTEROL FUMARATE 160-4.5 MCG/ACT IN AERO: 2.0000 | INHALATION_SPRAY | Freq: Two times a day (BID) | RESPIRATORY_TRACT | 4 refills | Status: DC

## 2021-05-13 MED ORDER — AMLODIPINE-OLMESARTAN 5-40 MG PO TABS: 1.0000 | ORAL_TABLET | Freq: Every day | ORAL | 2 refills | Status: DC

## 2021-05-13 NOTE — Progress Notes (Signed)
HPI: [name] is a 75 y.o. male, who is here today for AWV. He also would like to have his CPE. Last CPE 11/15/16. He does not remember having a medicare wellness before.  He was last seen on 02/13/20, when he established care.  Functional Status Survey: Is the patient deaf or have difficulty hearing?: No Does the patient have difficulty seeing, even when wearing glasses/contacts?: No Does the patient have difficulty concentrating, remembering, or making decisions?: No Does the patient have difficulty walking or climbing stairs?: No Does the patient have difficulty dressing or bathing?: No Does the patient have difficulty doing errands alone such as visiting a doctor's office or shopping?: No  Fall Risk  05/13/2021 02/13/2020 02/06/2018 06/22/2017 12/06/2016  Falls in the past year? 0 0 No No No  Number falls in past yr: 0 - - - 2 or more  Injury with Fall? 0 - - No -  Follow up Education provided - - - -   Providers he sees regularly:  Eye care provider: He does not remember name of provider but follows annually.  Depression screen PHQ 2/9 05/13/2021  Decreased Interest 0  Down, Depressed, Hopeless 0  PHQ - 2 Score 0   Vision Screening   Right eye Left eye Both eyes  Without correction 20/20 20/20 20/20   With correction       Mini-Cog - 05/13/21 1037     Normal clock drawing test? yes    How many words correct? 3            He exercises regularly: Elliptical for 30 min and floor exercises and light wt lifting 5 times per week. He follows a healthful diet. His wife cooks. He eats plenty of salad. No much red meat, usually chicken and fish. He also loves bread.  He lives with his wife. Sleeping about 6 hours. Negative for high alcohol intake, drink whisky when gathering with friends and wine some fridays. Former smoker, quit 30 years ago.  PSA 3.4 in 03/2018. Nocturia x 1-2 but more if he drinks more water around bedtime he > 3.  He is requesting  referral to GI, he is supposed to have EGD q 2 years. According to pt, there were some esophageal changes that require periodic follow up, ? Barrett's esophagus. Negative for dysphagia or heartburn. He is on Nexium 40 mg daily prn. He is also due for colonoscopy.  Hypertension: Dx'ed at age 56. Medications:Amlodipine-Olmesartan 5-40 mg daily.  Negative for unusual or severe headache, visual changes, exertional chest pain, dyspnea,  focal weakness, or edema.  Sodium 135 - 146 MMOL/L 137    Potassium 3.5 - 5.3 MMOL/L 4.8    Chloride 98 - 110 MMOL/L 102    CO2 23 - 30 MMOL/L 28    BUN 8 - 24 MG/DL 22    Glucose 70 - 99 MG/DL 103 High   Patients taking eltrombopag at doses >/= 100 mg daily may show falsely elevated values of 10% or greater.  Creatinine 0.50 - 1.50 MG/DL 1.03    Calcium 8.5 - 10.5 MG/DL 9.4    Anion Gap 4 - 14 MMOL/L 6    Est. GFR >=60 ML/MIN/1.73 M*2  ML/MIN/1.73 M*2 76     Hypothyroidism: He is on Levothyroxine 150 mcg daily. He had TSH done on 11/24/20 and low at < 0.10.  Hyperlipidemia: Currently on non pharmacologic treatment.  03/07/20:  Ref Range & Units 1 yr ago Comments  LDL Direct <  130 mg/dL 122    Total Cholesterol 25 - 199 MG/DL 186    Triglycerides 10 - 150 MG/DL 79    HDL Cholesterol 35 - 135 MG/DL 48    Total Chol / HDL Cholesterol <4.5 3.9    Non-HDL Cholesterol MG/DL 138      Lab Results  Component Value Date   CHOL 211 (H) 11/15/2016   HDL 60 11/15/2016   LDLCALC 137 (H) 11/15/2016   TRIG 72 11/15/2016   CHOLHDL 3.5 11/15/2016   Prediabetes: HgA1C 11/2016 was 5.8.  Asthma: He uses Symbicort daily prn, symptoms exacerbated by exercise.  2 months ago hit left LE against planter, developed hematoma. Evaluated by health provider and treated with local ice, still having pain after exercise. No limitation of ADL's. Negative for numbness or erythema.  Review of Systems  Constitutional:  Negative for activity change, appetite change, fatigue and  fever.  HENT:  Negative for nosebleeds, sore throat, trouble swallowing and voice change.   Eyes:  Negative for redness and visual disturbance.  Respiratory:  Negative for cough, shortness of breath and wheezing.   Cardiovascular:  Negative for chest pain, palpitations and leg swelling.  Gastrointestinal:  Negative for abdominal pain, blood in stool, nausea and vomiting.  Endocrine: Negative for cold intolerance, heat intolerance, polydipsia, polyphagia and polyuria.  Genitourinary:  Negative for decreased urine volume, dysuria, genital sores, hematuria and testicular pain.  Musculoskeletal:  Positive for arthralgias. Negative for gait problem.  Skin:  Negative for color change and rash.  Allergic/Immunologic: Positive for environmental allergies.  Neurological:  Negative for dizziness, syncope, weakness and headaches.  Hematological:  Negative for adenopathy. Does not bruise/bleed easily.  Psychiatric/Behavioral:  Negative for confusion and sleep disturbance.   All other systems reviewed and are negative.  Current Outpatient Medications on File Prior to Visit  Medication Sig Dispense Refill   esomeprazole (NEXIUM) 40 MG capsule Take 40 mg by mouth daily at 12 noon.     levothyroxine (SYNTHROID, LEVOTHROID) 150 MCG tablet Take 1 tablet (150 mcg total) by mouth daily. 90 tablet 3   No current facility-administered medications on file prior to visit.   Past Medical History:  Diagnosis Date   Allergy    Arthritis    Asthma    Hypertension    Hypogonadism male    Thyroid disease    Hypothyroidism   Varicose veins    Allergies  Allergen Reactions   Aspirin Anaphylaxis    Angioedema,    Penicillins Anaphylaxis   Ketorolac Tromethamine    Social History   Socioeconomic History   Marital status: Married    Spouse name: Not on file   Number of children: 2   Years of education: Not on file   Highest education level: Not on file  Occupational History   Not on file  Tobacco Use    Smoking status: Never   Smokeless tobacco: Never  Vaping Use   Vaping Use: Never used  Substance and Sexual Activity   Alcohol use: Yes    Alcohol/week: 9.0 standard drinks    Types: 5 Cans of beer, 4 Shots of liquor per week   Drug use: No   Sexual activity: Not Currently  Other Topics Concern   Not on file  Social History Narrative   Not on file   Social Determinants of Health   Financial Resource Strain: Not on file  Food Insecurity: Not on file  Transportation Needs: Not on file  Physical Activity: Not on file  Stress: Not on file  Social Connections: Not on file   Vitals:   05/13/21 1004  BP: 130/76  Pulse: 74  Resp: 16  SpO2: 91%   Body mass index is 28.84 kg/m.  Physical Exam Vitals and nursing note reviewed.  Constitutional:      General: He is not in acute distress.    Appearance: He is well-developed.  HENT:     Head: Atraumatic.     Right Ear: Tympanic membrane, ear canal and external ear normal.     Left Ear: Tympanic membrane, ear canal and external ear normal.     Mouth/Throat:     Mouth: Mucous membranes are dry.     Pharynx: Oropharynx is clear.  Eyes:     Conjunctiva/sclera: Conjunctivae normal.     Pupils: Pupils are equal, round, and reactive to light.  Neck:     Thyroid: No thyromegaly.     Trachea: No tracheal deviation.  Cardiovascular:     Rate and Rhythm: Normal rate and regular rhythm.     Pulses:          Dorsalis pedis pulses are 2+ on the right side and 2+ on the left side.     Heart sounds: No murmur heard. Pulmonary:     Effort: Pulmonary effort is normal. No respiratory distress.     Breath sounds: Normal breath sounds.  Abdominal:     Palpations: Abdomen is soft. There is no hepatomegaly or mass.     Tenderness: There is no abdominal tenderness.  Genitourinary:    Comments: Refused,no concerns. Musculoskeletal:        General: No tenderness.     Cervical back: Normal range of motion.     Left ankle: Normal range of  motion.       Legs:     Comments: No major deformities appreciated and no signs of synovitis.  Lymphadenopathy:     Cervical: No cervical adenopathy.  Skin:    General: Skin is warm.     Findings: No erythema.  Neurological:     Mental Status: He is alert and oriented to person, place, and time.     Cranial Nerves: No cranial nerve deficit.     Sensory: No sensory deficit.     Coordination: Coordination normal.     Gait: Gait normal.     Deep Tendon Reflexes:     Reflex Scores:      Bicep reflexes are 2+ on the right side and 2+ on the left side.      Patellar reflexes are 2+ on the right side and 2+ on the left side.  ASSESSMENT AND PLAN:  Clarence Sanders was seen today for AWV and CPE.  Orders Placed This Encounter  Procedures   Flu Vaccine QUAD High Dose(Fluad)   PSA(Must document that pt has been informed of limitations of PSA testing.)   Lipid panel   Hemoglobin A1c   Comprehensive metabolic panel   TSH   T4, free   T3, free   Ambulatory referral to Gastroenterology   Lab Results  Component Value Date   CHOL 204 (H) 05/13/2021   HDL 60.50 05/13/2021   LDLCALC 124 (H) 05/13/2021   TRIG 93.0 05/13/2021   CHOLHDL 3 05/13/2021   Lab Results  Component Value Date   HGBA1C 6.0 05/13/2021   Lab Results  Component Value Date   TSH 0.72 05/13/2021   Lab Results  Component Value Date   CREATININE 0.84 05/13/2021  BUN 17 05/13/2021   NA 138 05/13/2021   K 4.3 05/13/2021   CL 102 05/13/2021   CO2 27 05/13/2021   Lab Results  Component Value Date   ALT 24 05/13/2021   AST 23 05/13/2021   ALKPHOS 55 05/13/2021   BILITOT 0.6 05/13/2021   Lab Results  Component Value Date   PSA 3.30 05/13/2021   Routine general medical examination at a health care facility We discussed the importance of regular physical activity and healthy diet for prevention of chronic illness and/or complications. Preventive guidelines reviewed. Vaccines up to date. Next  CPE in a year.  The 10-year ASCVD risk score (Arnett DK, et al., 2019) is: 26%   Values used to calculate the score:     Age: 63 years     Sex: Male     Is Non-Hispanic African American: No     Diabetic: No     Tobacco smoker: No     Systolic Blood Pressure: 865 mmHg     Is BP treated: Yes     HDL Cholesterol: 60.5 mg/dL     Total Cholesterol: 204 mg/dL  Benign essential hypertension BP adequately controlled. No changes in current management. F/U in 6 months.  -     amLODipine-olmesartan (AZOR) 5-40 MG tablet; Take 1 tablet by mouth daily.  Hypothyroidism (acquired) No changes in current management. Further recommendations according to TSH.  Medicare annual wellness visit, subsequent We discussed the importance of staying active, physically and mentally, as well as the benefits of a healthy/balance diet. Low impact exercise that involve stretching and strengthing are ideal.  We discussed preventive screening for the next 5-10 years, summery of recommendations given in AVS.   Goals       Acknowledge receipt of Advanced Directive package        This is a list of the screening recommended for you and due dates:  Health Maintenance  Topic Date Due   COVID-19 Vaccine (1) Never done   Zoster (Shingles) Vaccine (1 of 2) Never done   Colon Cancer Screening  01/30/2020   Tetanus Vaccine  04/09/2024   Flu Shot  Completed   Hepatitis C Screening: USPSTF Recommendation to screen - Ages 18-79 yo.  Completed   HPV Vaccine  Aged Out   Fall prevention. Advance directives and end of life discussed, his daughter is his POA. Advance health directives package given to be completed.  Hyperlipidemia, unspecified hyperlipidemia type Non pharmacologic treatment to continue for now. Further recommendations will be given according to 10 years CVD risk score and lipid panel numbers.  Prediabetes Continue a healthy life style for diabetes prevention.  Further recommendations according to  HgA1C result.  BPH associated with nocturia Symptoms stable.  Colon cancer screening -     Ambulatory referral to Gastroenterology  Gastroesophageal reflux disease, unspecified whether esophagitis present -     Ambulatory referral to Gastroenterology  Need for influenza vaccination -     Flu Vaccine QUAD High Dose(Fluad)  Mild intermittent asthma without complication Adequately controlled. No changes in current management.  -     budesonide-formoterol (SYMBICORT) 160-4.5 MCG/ACT inhaler; Inhale 2 puffs into the lungs 2 (two) times daily.  Return in 6 months (on 11/11/2021) for HTN.   Clarence Muldrow G. Martin[place], MD  [facility]. [place] office.

## 2021-05-13 NOTE — Patient Instructions (Addendum)
[name] , Thank you for taking time to come for your Medicare Wellness Visit. I appreciate your ongoing commitment to your health goals. Please review the following plan we discussed and let me know if I can assist you in the future.   These are the goals we discussed:  Goals       Acknowledge receipt of Advanced Directive package        This is a list of the screening recommended for you and due dates:  Health Maintenance  Topic Date Due   COVID-19 Vaccine (1) Never done   Zoster (Shingles) Vaccine (1 of 2) Never done   Colon Cancer Screening  01/30/2020   Flu Shot  03/09/2021   Tetanus Vaccine  04/09/2024   Hepatitis C Screening: USPSTF Recommendation to screen - Ages 18-79 yo.  Completed   HPV Vaccine  Aged Out   Mantenimiento de la salud en los hombres Health Maintenance, Male Adoptar un estilo de vida saludable y recibir atencin preventiva son importantes para promover la salud y Musician. Consulte al mdico sobre: El esquema adecuado para hacerse pruebas y exmenes peridicos. Cosas que puede hacer por su cuenta para prevenir enfermedades y [place] sano. Qu debo saber sobre la dieta, el peso y el ejercicio? Consuma una dieta saludable  Consuma una dieta que incluya muchas verduras, frutas, productos lcteos con bajo contenido de [place] y Advertising account planner. No consuma muchos alimentos ricos en grasas slidas, azcares agregados o sodio. Mantenga un peso saludable El ndice de masa muscular [facility]) es una medida que puede utilizarse para identificar posibles problemas de [place]. Proporciona una estimacin de la grasa corporal basndose en el peso y la altura. Su mdico puede ayudarle a Radiation protection practitioner Minco y a Scientist, forensic o Theatre manager un peso saludable. Haga ejercicio con regularidad Haga ejercicio con regularidad. Esta es una de las prcticas ms importantes que puede hacer por su salud. La [employer] de los adultos deben seguir estas pautas: Optometrist, al menos, 150 minutos de  actividad fsica por semana. El ejercicio debe aumentar la frecuencia cardaca y Nature conservation officer transpirar (ejercicio de intensidad moderada). Hacer ejercicios de fortalecimiento por lo [employer] por semana. Agregue esto a su plan de ejercicio de intensidad moderada. Pasar menos tiempo sentados. Incluso la actividad fsica ligera puede ser beneficiosa. Controle sus niveles de colesterol y lpidos en la sangre Comience a realizarse anlisis de lpidos y Research officer, trade union en la sangre a los 16 aos y luego reptalos cada 5 aos. Es posible que Automotive engineer los niveles de colesterol con mayor frecuencia si: Sus niveles de lpidos y colesterol son altos. Es mayor de 100 aos. Presenta un alto riesgo de padecer enfermedades cardacas. Qu debo saber sobre las pruebas de deteccin del cncer? Muchos tipos de cncer pueden detectarse de manera temprana y, a menudo, pueden prevenirse. Segn su historia clnica y sus antecedentes familiares, es posible que deba realizarse pruebas de deteccin del cncer en diferentes edades. Esto puede incluir pruebas de deteccin de lo siguiente: Surveyor, minerals. Cncer de prstata. Cncer de piel. Cncer de pulmn. Qu debo saber sobre la enfermedad cardaca, la diabetes y la hipertensin arterial? Presin arterial y enfermedad cardaca La hipertensin arterial causa enfermedades cardacas y [place] el riesgo de accidente cerebrovascular. Es ms probable que esto se manifieste en las personas que tienen lecturas de presin arterial alta, tienen ascendencia africana o tienen sobrepeso. Hable con el mdico sobre sus valores de presin arterial deseados. Hgase controlar la presin arterial: Cada 3 a 5 aos si  tiene entre 18 y 53 aos. Todos los aos si es mayor de 40 aos. Si tiene entre 83 y 68 aos y es fumador o Insurance account manager, pregntele al mdico si debe realizarse una prueba de deteccin de aneurisma artico abdominal (AAA) por nica vez. Diabetes Realcese  exmenes de deteccin de la diabetes con regularidad. Este anlisis revisa el nivel de azcar en la sangre en [place]. Hgase las pruebas de deteccin: Cada tres aos despus de los 74 aos de edad si tiene un peso normal y un bajo riesgo de padecer diabetes. Con ms frecuencia y a partir de [place] edad inferior si tiene sobrepeso o un alto riesgo de padecer diabetes. Qu debo saber sobre la prevencin de infecciones? Hepatitis B Si tiene un riesgo ms alto de contraer hepatitis B, debe someterse a un examen de deteccin de este virus. Hable con el mdico para averiguar si tiene riesgo de contraer la infeccin por hepatitis B. Hepatitis C Se recomienda un anlisis de [place] para: Todos los que nacieron entre 1945 y [phone]. Todas las personas que tengan un riesgo de haber contrado hepatitis C. Enfermedades de transmisin sexual (ETS) Debe realizarse pruebas de deteccin de ITS todos los aos, incluidas la gonorrea y la clamidia, si: Es sexualmente activo y es menor de 49 aos. Es mayor de 50 aos, y Investment banker, operational informa que corre riesgo de tener este tipo de infecciones. La actividad sexual ha cambiado desde que le hicieron la ltima prueba de deteccin y tiene un riesgo mayor de Best boy clamidia o Radio broadcast assistant. Pregntele al mdico si usted tiene riesgo. Pregntele al mdico si usted tiene un alto riesgo de Museum/gallery curator VIH. El mdico tambin puede recomendarle un medicamento recetado para ayudar a evitar la infeccin por el VIH. Si elige tomar medicamentos para prevenir el VIH, primero debe [employer] de deteccin del VIH. Luego debe hacerse anlisis cada 3 meses mientras est tomando los medicamentos. Siga estas instrucciones en su casa: Estilo de vida No consuma ningn producto que contenga nicotina o tabaco, como cigarrillos, cigarrillos electrnicos y tabaco de Higher education careers adviser. Si necesita ayuda para dejar de fumar, consulte al mdico. No consuma drogas. No comparta agujas. Solicite ayuda a su mdico si  necesita apoyo o informacin para abandonar las drogas. Consumo de alcohol No beba alcohol si el mdico se lo prohbe. Si bebe alcohol: Limite la cantidad que consume de 0 a 2 medidas por da. Est atento a la cantidad de alcohol que hay en las bebidas que toma. En los [place], una medida equivale a una botella de cerveza de 12 oz (355 ml), un vaso de vino de 5 oz (148 ml) o un vaso de una bebida alcohlica de alta graduacin de 1 oz (44 ml). Instrucciones generales Realcese los estudios de rutina de la salud, dentales y de Public librarian. [place]. Infrmele a su mdico si: Se siente deprimido con frecuencia. Alguna vez ha sido vctima de [place] o no se siente seguro en su casa. Resumen Adoptar un estilo de vida saludable y recibir atencin preventiva son importantes para promover la salud y Musician. Siga las instrucciones del mdico acerca de una dieta saludable, el ejercicio y la realizacin de pruebas o exmenes para Engineer, building services. Siga las instrucciones del mdico con respecto al control del colesterol y la presin arterial. Esta informacin no tiene Marine scientist el consejo del mdico. Asegrese de hacerle al mdico cualquier pregunta que tenga. Document Revised: 08/16/2018 Document Reviewed: 08/16/2018 Elsevier Patient  Education  2022 [employer].

## 2021-05-14 MED ORDER — LEVOTHYROXINE SODIUM 150 MCG PO TABS: 150.0000 ug | ORAL_TABLET | Freq: Every day | ORAL | 3 refills | Status: DC

## 2021-05-22 ENCOUNTER — Ambulatory Visit
Admission: RE | Admit: 2021-05-22 | Discharge: 2021-05-22 | Disposition: A | Discharge status: Home / Self Care | Payer: Medicare Other | Source: Ambulatory Visit | Attending: Family Medicine | Admitting: Family Medicine

## 2021-05-22 DIAGNOSIS — Z136 Encounter for screening for cardiovascular disorders: Secondary | ICD-10-CM

## 2021-05-27 ENCOUNTER — Encounter: Payer: Self-pay | Admitting: Family Medicine

## 2021-05-27 DIAGNOSIS — I700 Atherosclerosis of aorta: Secondary | ICD-10-CM | POA: Insufficient documentation

## 2021-07-16 ENCOUNTER — Telehealth: Payer: Self-pay | Admitting: Family Medicine

## 2021-07-16 DIAGNOSIS — J4520 Mild intermittent asthma, uncomplicated: Secondary | ICD-10-CM

## 2021-07-16 NOTE — Telephone Encounter (Signed)
Patient came into the office asking if he could receive a refill of budesonide-formoterol (SYMBICORT) 160-4.5 MCG/ACT inhaler and Euthryox to be sent to [facility] # [place], [place].  Please advise.

## 2021-07-17 MED ORDER — BUDESONIDE-FORMOTEROL FUMARATE 160-4.5 MCG/ACT IN AERO
2.0000 | INHALATION_SPRAY | Freq: Two times a day (BID) | RESPIRATORY_TRACT | 4 refills | Status: DC
Start: 2021-07-17 — End: 2021-10-23

## 2021-07-17 MED ORDER — LEVOTHYROXINE SODIUM 150 MCG PO TABS: 150.0000 ug | ORAL_TABLET | Freq: Every day | ORAL | 3 refills | Status: DC

## 2021-07-17 NOTE — Telephone Encounter (Signed)
Rx's sent in. °

## 2021-09-01 ENCOUNTER — Telehealth: Payer: Self-pay | Admitting: Family Medicine

## 2021-09-01 DIAGNOSIS — I10 Essential (primary) hypertension: Secondary | ICD-10-CM

## 2021-09-01 MED ORDER — AMLODIPINE-OLMESARTAN 5-40 MG PO TABS: 1.0000 | ORAL_TABLET | Freq: Every day | ORAL | 3 refills | Status: DC

## 2021-09-01 NOTE — Telephone Encounter (Signed)
Rx has been sent in, patient is aware.

## 2021-09-01 NOTE — Telephone Encounter (Signed)
Patient came in stating that he is needing a refill of amLODipine-olmesartan (AZOR) 5-40 MG tablet to be sent to [facility] # [place], [place].  Patient would like a phone call once prescription has been sent over.

## 2021-10-22 ENCOUNTER — Telehealth: Payer: Self-pay | Admitting: Family Medicine

## 2021-10-22 DIAGNOSIS — J4520 Mild intermittent asthma, uncomplicated: Secondary | ICD-10-CM

## 2021-10-22 NOTE — Telephone Encounter (Signed)
Patient needs a refill on Symbicort inhaler. ? ?Clarence Sanders on [address]. ?

## 2021-10-23 MED ORDER — BUDESONIDE-FORMOTEROL FUMARATE 160-4.5 MCG/ACT IN AERO: 2.0000 | INHALATION_SPRAY | Freq: Two times a day (BID) | RESPIRATORY_TRACT | 2 refills | Status: DC

## 2021-10-23 NOTE — Telephone Encounter (Signed)
Rx sent in

## 2021-11-30 ENCOUNTER — Telehealth: Payer: Self-pay | Admitting: Family Medicine

## 2021-11-30 DIAGNOSIS — I10 Essential (primary) hypertension: Secondary | ICD-10-CM

## 2021-11-30 DIAGNOSIS — J4520 Mild intermittent asthma, uncomplicated: Secondary | ICD-10-CM

## 2021-11-30 MED ORDER — AMLODIPINE-OLMESARTAN 5-40 MG PO TABS: 1.0000 | ORAL_TABLET | Freq: Every day | ORAL | 0 refills | Status: DC

## 2021-11-30 MED ORDER — BUDESONIDE-FORMOTEROL FUMARATE 160-4.5 MCG/ACT IN AERO
2.0000 | INHALATION_SPRAY | Freq: Two times a day (BID) | RESPIRATORY_TRACT | 0 refills | Status: DC
Start: 2021-11-30 — End: 2022-05-05

## 2021-11-30 NOTE — Telephone Encounter (Signed)
Rx's sent in for a month & pt's daughter is aware.  ?

## 2021-11-30 NOTE — Telephone Encounter (Signed)
Pt daughter calling requesting 1 month refill for patient's  amLODipine-olmesartan (AZOR) 5-40 MG tablet and  budesonide-formoterol (SYMBICORT) 160-4.5 MCG/ACT inhaler  patient will be traveling to visit family and would like a refill to get him thru to his next scheduled appointment on 01/05/22   ?[facility] # [place], [place] Phone:  [phone]  ?Fax:  [phone]  ?  ?Pt daughter requesting a call to verify if this can be done.  ?

## 2021-12-07 ENCOUNTER — Ambulatory Visit: Payer: Medicare Other | Admitting: Family Medicine

## 2021-12-09 DIAGNOSIS — G4733 Obstructive sleep apnea (adult) (pediatric): Secondary | ICD-10-CM | POA: Diagnosis not present

## 2022-01-01 NOTE — Progress Notes (Unsigned)
HPI: Clarence Sanders is a 76 y.o. male, who is here today to follow on recent visit. He was last seen on 05/13/2021.  Hypertension on amlodipine-olmesartan 5-40 mg daily. Negative for severe/frequent headache, visual changes, chest pain, dyspnea, palpitation, focal weakness, or edema.  Lab Results  Component Value Date   CREATININE 0.84 05/13/2021   BUN 17 05/13/2021   NA 138 05/13/2021   K 4.3 05/13/2021   CL 102 05/13/2021   CO2 27 05/13/2021   Hyperlipidemia and aortic atherosclerosis: He is not on pharmacologic treatment. Crestor 10 mg was recommended last visit, he did not receive a call and did not log in his My chart.  He has been on statins in the past, well-tolerated.  Lab Results  Component Value Date   CHOL 204 (H) 05/13/2021   HDL 60.50 05/13/2021   LDLCALC 124 (H) 05/13/2021   TRIG 93.0 05/13/2021   CHOLHDL 3 05/13/2021   -Foot pain, IP joints and metatarsus, exacerbated by walking. Negative for calves pain, edema or erythema.. More severe in right MTP pain, concerned about gout. No joint edema or erythema.  Arthralgias: Hands, lower back,hips,and ankles. Pain is exacerbated by exercise, next day she feels sore all over. No pain while exercising. OTC Fish oil helps.  -Prediabetes: Negative for polydipsia,polyuria, or polyphagia. 05/2021 HgA1C was 6.0.  -Anxiety: Irritability and stressed for no reason. Negative for depressed mood.    01/05/2022    9:26 AM 05/13/2021   10:06 AM 02/13/2020    8:19 AM 02/06/2018    5:27 PM 06/22/2017    2:51 PM  Depression screen PHQ 2/9  Decreased Interest 0 0 0 0 0  Down, Depressed, Hopeless 0 0 0 0 0  PHQ - 2 Score 0 0 0 0 0   Occasional insomnia, in general sleeps about 5-6 hours.  -Right ear intense pruritis , intermittently for years. Negative for earache, changes in hearing, or ear drainage. He has not tried OTC medications.  Review of Systems  Constitutional:  Negative for activity change, appetite  change and fever.  HENT:  Negative for mouth sores, nosebleeds and sore throat.   Respiratory:  Negative for cough and wheezing.   Gastrointestinal:  Negative for abdominal pain, nausea and vomiting.  Genitourinary:  Negative for decreased urine volume and hematuria.  Neurological:  Negative for syncope, facial asymmetry, weakness and numbness.  Psychiatric/Behavioral:  Negative for confusion. The patient is nervous/anxious.   Rest see pertinent positives and negatives per HPI.  Current Outpatient Medications on File Prior to Visit  Medication Sig Dispense Refill   amLODipine-olmesartan (AZOR) 5-40 MG tablet Take 1 tablet by mouth daily. 30 tablet 0   budesonide-formoterol (SYMBICORT) 160-4.5 MCG/ACT inhaler Inhale 2 puffs into the lungs 2 (two) times daily. 1 each 0   esomeprazole (NEXIUM) 40 MG capsule Take 40 mg by mouth daily at 12 noon.     levothyroxine (SYNTHROID) 150 MCG tablet Take 1 tablet (150 mcg total) by mouth daily. 90 tablet 3   No current facility-administered medications on file prior to visit.   Past Medical History:  Diagnosis Date   Allergy    Arthritis    Asthma    Hypertension    Hypogonadism male    Thyroid disease    Hypothyroidism   Varicose veins    Allergies  Allergen Reactions   Aspirin Anaphylaxis    Angioedema,    Penicillins Anaphylaxis   Ketorolac Tromethamine     Social History   Socioeconomic History  Marital status: Married    Spouse name: Not on file   Number of children: 2   Years of education: Not on file   Highest education level: Not on file  Occupational History   Not on file  Tobacco Use   Smoking status: Never   Smokeless tobacco: Never  Vaping Use   Vaping Use: Never used  Substance and Sexual Activity   Alcohol use: Yes    Alcohol/week: 9.0 standard drinks    Types: 5 Cans of beer, 4 Shots of liquor per week   Drug use: No   Sexual activity: Not Currently  Other Topics Concern   Not on file  Social History  Narrative   Not on file   Social Determinants of Health   Financial Resource Strain: Not on file  Food Insecurity: Not on file  Transportation Needs: Not on file  Physical Activity: Not on file  Stress: Not on file  Social Connections: Not on file   Vitals:   01/05/22 0918  BP: 128/78  Pulse: 71  Resp: 16  Temp: 98 F (36.7 C)  SpO2: 97%   Body mass index is 27.77 kg/m.  Physical Exam Vitals and nursing note reviewed.  Constitutional:      General: He is not in acute distress.    Appearance: He is well-developed.  HENT:     Head: Normocephalic and atraumatic.     Right Ear: Tympanic membrane and external ear normal.     Left Ear: Tympanic membrane, ear canal and external ear normal.     Ears:     Comments: Right ear canal dry, whitish debris. No erythema, drainage,or tenderness.    Mouth/Throat:     Mouth: Mucous membranes are moist.     Pharynx: Oropharynx is clear.  Eyes:     Conjunctiva/sclera: Conjunctivae normal.  Cardiovascular:     Rate and Rhythm: Normal rate and regular rhythm.     Pulses:          Dorsalis pedis pulses are 2+ on the right side and 2+ on the left side.     Heart sounds: No murmur heard.    Comments: Varicose veins LE, bilateral. Pulmonary:     Effort: Pulmonary effort is normal. No respiratory distress.     Breath sounds: Normal breath sounds.  Abdominal:     Palpations: Abdomen is soft. There is no hepatomegaly or mass.     Tenderness: There is no abdominal tenderness.  Musculoskeletal:     Right foot: Tenderness present. No swelling.     Comments: Pain or 1st MTP joint elicited with passive flexion and extension. No signs of synovitis or significant deformities.  Lymphadenopathy:     Cervical: No cervical adenopathy.  Skin:    General: Skin is warm.     Findings: No erythema or rash.  Neurological:     Mental Status: He is alert and oriented to person, place, and time.     Cranial Nerves: No cranial nerve deficit.     Gait:  Gait normal.  Psychiatric:     Comments: Well groomed, good eye contact.   ASSESSMENT AND PLAN:  Clarence Sanders was seen today for follow-up.  Diagnoses and all orders for this visit: Orders Placed This Encounter  Procedures   DG Foot Complete Right   Basic metabolic panel   Hemoglobin A1c   Uric Acid   Lab Results  Component Value Date   LABURIC 8.5 (H) 01/05/2022   Lab Results  Component Value  Date   HGBA1C 5.9 01/05/2022   Lab Results  Component Value Date   CREATININE 0.91 01/05/2022   BUN 24 (H) 01/05/2022   NA 137 01/05/2022   K 4.3 01/05/2022   CL 100 01/05/2022   CO2 29 01/05/2022   Right foot pain We discussed possible etiologies. History does not suggest gout. ?  OA. Uric acid and x-ray ordered today  Generalized osteoarthritis of multiple sites We discussed diagnosis, prognosis, and treatment options. Continue fish oil. After discussion of some side effects, he agrees with trying duloxetine 30 mg daily. Low impact exercise recommended. Follow-up in 3 to 4 months, before if needed.  Anxiety disorder, unspecified We discussed treatment options. Because also OA,Duloxetine 30 mg was recommended. Instructed about warning signs. F/U in 3-4 months, before if needed.  Essential (primary) hypertension BP adequately controlled. Continue amlodipine-olmesartan 5-40 mg daily. Continue low-salt/DASH diet. Eye exam is current. Monitoring BP at home regularly. F/U in 4 months.  Hyperlipidemia He has not on pharmacologic treatment. Because elevated 10 years ASCVD risk (27%) and aortic atherosclerosis, he benefits from statins. Crestor 10 mg daily recommended. He has taken medication before and well tolerated.  Atherosclerosis of aorta ([place]) We discussed CV benefits from statins. Crestor 10 mg started today.  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 Symptoms are not frequent but it is very pruritic at times. Recommend  topical steroid,Fluocinolone otic drops 1 gtt daily prn, some side effects discussed.  Prediabetes HgA1C 6.0 in 05/2021. Continue a healthy lifestyle for diabetes prevention. Further recommendation will be given according to hemoglobin A1c result.  I spent a total of 48 minutes in both face to face and non face to face activities for this visit on the date of this encounter. During this time history was obtained and documented, examination was performed, prior labs/imaging reviewed, and assessment/plan discussed. Called pt to discussed lab results, elevated uric acid, Allopurinol 100 mg daily, side effects discussed, including gout exacerbation.  Return in about 4 months (around 05/08/2022).  Joyce Leckey G. Martinique, MD  [facility]. [place] office.

## 2022-01-05 ENCOUNTER — Ambulatory Visit (INDEPENDENT_AMBULATORY_CARE_PROVIDER_SITE_OTHER): Payer: Medicare Other | Admitting: Family Medicine

## 2022-01-05 ENCOUNTER — Encounter: Payer: Self-pay | Admitting: Family Medicine

## 2022-01-05 ENCOUNTER — Ambulatory Visit (INDEPENDENT_AMBULATORY_CARE_PROVIDER_SITE_OTHER): Payer: Medicare Other

## 2022-01-05 VITALS — BP 128/78 | HR 71 | Temp 98.0°F | Resp 16 | Ht 66.61 in | Wt 175.2 lb

## 2022-01-05 DIAGNOSIS — E790 Hyperuricemia without signs of inflammatory arthritis and tophaceous disease: Secondary | ICD-10-CM | POA: Diagnosis not present

## 2022-01-05 DIAGNOSIS — R7303 Prediabetes: Secondary | ICD-10-CM

## 2022-01-05 DIAGNOSIS — H6061 Unspecified chronic otitis externa, right ear: Secondary | ICD-10-CM | POA: Diagnosis not present

## 2022-01-05 DIAGNOSIS — M79671 Pain in right foot: Secondary | ICD-10-CM | POA: Diagnosis not present

## 2022-01-05 DIAGNOSIS — M159 Polyosteoarthritis, unspecified: Secondary | ICD-10-CM | POA: Diagnosis not present

## 2022-01-05 DIAGNOSIS — I10 Essential (primary) hypertension: Secondary | ICD-10-CM

## 2022-01-05 DIAGNOSIS — I700 Atherosclerosis of aorta: Secondary | ICD-10-CM

## 2022-01-05 DIAGNOSIS — E785 Hyperlipidemia, unspecified: Secondary | ICD-10-CM

## 2022-01-05 DIAGNOSIS — F419 Anxiety disorder, unspecified: Secondary | ICD-10-CM

## 2022-01-05 DIAGNOSIS — M19071 Primary osteoarthritis, right ankle and foot: Secondary | ICD-10-CM | POA: Diagnosis not present

## 2022-01-05 DIAGNOSIS — E039 Hypothyroidism, unspecified: Secondary | ICD-10-CM

## 2022-01-05 LAB — BASIC METABOLIC PANEL
BUN: 24 mg/dL — ABNORMAL HIGH (ref 6–23)
CO2: 29 mEq/L (ref 19–32)
Calcium: 9.7 mg/dL (ref 8.4–10.5)
Chloride: 100 mEq/L (ref 96–112)
Creatinine, Ser: 0.91 mg/dL (ref 0.40–1.50)
GFR: 82.53 mL/min (ref >60.00)
Glucose, Bld: 90 mg/dL (ref 70–99)
Potassium: 4.3 mEq/L (ref 3.5–5.1)
Sodium: 137 mEq/L (ref 135–145)

## 2022-01-05 LAB — URIC ACID: Uric Acid, Serum: 8.5 mg/dL — ABNORMAL HIGH (ref 4.0–7.8)

## 2022-01-05 LAB — HEMOGLOBIN A1C: Hgb A1c MFr Bld: 5.9 % (ref 4.6–6.5)

## 2022-01-05 MED ORDER — FLUOCINOLONE ACETONIDE 0.01 % OT OIL: 1.0000 [drp] | TOPICAL_OIL | Freq: Every day | OTIC | 0 refills | Status: AC | PRN

## 2022-01-05 MED ORDER — DULOXETINE HCL 30 MG PO CPEP: 30.0000 mg | ORAL_CAPSULE | Freq: Every day | ORAL | 2 refills | Status: DC

## 2022-01-05 MED ORDER — ROSUVASTATIN CALCIUM 10 MG PO TABS: 10.0000 mg | ORAL_TABLET | Freq: Every day | ORAL | 3 refills | Status: DC

## 2022-01-05 MED ORDER — ALLOPURINOL 100 MG PO TABS: 100.0000 mg | ORAL_TABLET | Freq: Every day | ORAL | 1 refills | Status: DC

## 2022-01-05 MED ORDER — FLUOCINOLONE ACETONIDE 0.01 % OT OIL
1.0000 [drp] | TOPICAL_OIL | Freq: Every day | OTIC | 0 refills | Status: DC | PRN
Start: 2022-01-05 — End: 2022-01-05

## 2022-01-05 NOTE — Assessment & Plan Note (Addendum)
BP adequately controlled. Continue amlodipine-olmesartan 5-40 mg daily. Continue low-salt/DASH diet. Eye exam is current. Monitoring BP at home regularly. F/U in 4 months.

## 2022-01-05 NOTE — Patient Instructions (Addendum)
A few things to remember from today's visit:  Benign essential hypertension - Plan: Basic metabolic panel  Hyperlipidemia, unspecified hyperlipidemia type - Plan: rosuvastatin (CRESTOR) 10 MG tablet  Atherosclerosis of aorta ([place]), Chronic - Plan: rosuvastatin (CRESTOR) 10 MG tablet  Generalized osteoarthritis of multiple sites  Prediabetes - Plan: Hemoglobin A1c  Right foot pain - Plan: Uric Acid, DG Foot Complete Right  Chronic non-infective otitis externa of right ear, unspecified type - Plan: Fluocinolone Acetonide 0.01 % OIL  If you need refills please call your pharmacy. Do not use My Chart to request refills or for acute issues that need immediate attention.   Please be sure medication list is accurate. If a new problem present, please set up appointment sooner than planned today.  [facility] Address: [address], [place], [place] [address] Phone: [phone]    Llame a pedir la cita con el gastroenterologo.  Hoy empezamos Duloxetine 30 mg para la ansiedad y Conservation officer, historic buildings de las coyunturas. Crestor para el colesterol, lo toma diario. El dolor de los dedos de los pies parece artritis, no suena como gota. Artrosis Osteoarthritis  La artrosis es un tipo de artritis. Esta afeccin produce dolor o enfermedad en las articulaciones. La artrosis afecta al tejido que cubre los extremos de los huesos en las articulaciones (cartlago). El cartlago acta como amortiguador [employer] y los ayuda a moverse con suavidad. La artrosis se presenta cuando el cartlago de las articulaciones se gasta. A veces, la artrosis se denomina artritis "por uso y desgaste". La artrosis es la forma ms frecuente de artritis. A menudo, afecta a las [employer]. Es una afeccin que empeora con el [place]. Las articulaciones afectadas con mayor frecuencia por esta afeccin se encuentran en los dedos de Marriott, los dedos de Raytheon, las caderas, las rodillas y la columna  vertebral, incluyendo el cuello y la parte inferior de la espalda. Cules son las causas? Esta afeccin es causada por el desgaste del cartlago que cubre los extremos de [place]. Qu incrementa el riesgo? Los siguientes factores pueden hacer que sea ms propenso a Armed forces training and education officer afeccin: Ser mayor de 23 aos. Obesidad. Uso excesivo de Water engineer. Lesin pasada de Insurance claims handler. Ciruga pasada en una articulacin. Antecedentes familiares de artrosis. Cules son los signos o sntomas? Los principales sntomas de esta enfermedad son dolor, hinchazn y Advertising account executive. Otros sntomas pueden incluir: Agrandamiento de Water engineer. Aumento del dolor y dao adicional causado por pequeos trozos de [employer] o Database administrator que se desprenden y flotan dentro de Water engineer. Formacin de pequeos depsitos de hueso (osteofitos) en los extremos de Water engineer. Una sensacin de chirrido o raspado dentro de la articulacin al moverla. Sonidos de chasquido o crujido al [employer]. Dificultad para caminar o hacer ejercicio. Incapacidad para agarrar objetos, girar las manos o [employer] movimientos de las manos y los dedos. Cmo se diagnostica? Esta afeccin se puede diagnosticar en funcin de lo siguiente: Sus antecedentes mdicos. Un examen fsico. Sus sntomas. Radiografas de la(s) articulacin(es) afectada(s). Anlisis de sangre para descartar otros tipos de artritis. Cmo se trata? No hay cura para esta enfermedad, pero el tratamiento puede ayudar a Financial controller y Teacher, English as a foreign language el funcionamiento de Water engineer. El tratamiento puede incluir una combinacin de terapias, Manito las siguientes: Tcnicas de alivio del dolor, como: Aplicacin de calor y fro en la articulacin. Masajes. Una forma de psicoterapia llamada terapia cognitivo conductual (TCC). Esta terapia le ayuda a establecer objetivos y  a Patent attorney de los cambios que hace. Analgsicos  y antiinflamatorios. Los medicamentos pueden tomarse por boca o Kellogg. Incluyen los siguientes: Antiinflamatorios no esteroideos (AINE), como el ibuprofeno. Medicamentos recetados. Antiinflamatorios fuertes (corticoesteroides). Ciertos suplementos nutricionales. Un programa de ejercicios recomendado. Puede trabajar con un fisioterapeuta. Dispositivos de [place], como un dispositivo ortopdico, una frula, un guante especial o un bastn. Un plan de control del peso. [place], como: Imelda Pillow. Se hace para volver a posicionar los Aff[employer] Services y Best boy o para Charity fundraiser los trozos sueltos de hueso y Database administrator. Ciruga de reemplazo articular. Es posible que necesite esta ciruga si tiene una artrosis [place]. Siga estas instrucciones en su casa: [place] articulaciones afectadas como se lo haya indicado el mdico. Haga actividad fsica como se lo haya indicado el mdico. Este puede recomendarle tipos especficos de ejercicios, por ejemplo: Ejercicios de fortalecimiento. Se realizan para fortalecer los msculos que sostienen las articulaciones afectadas por la artritis. Ejercicios aerbicos. Son ejercicios, como caminar a paso ligero o hacer gimnasia [place] acutica, que aumentan la frecuencia cardaca. Actividades de amplitud de movimientos. Estos ayudan a que las articulaciones se muevan con ms facilidad. Ejercicios de equilibrio y [place]. Control del dolor, la rigidez y la hinchazn     Si se lo indican, aplique calor en la zona afectada con la frecuencia que le haya indicado el mdico. Use la fuente de calor que el mdico le recomiende, como una compresa de calor hmedo o una almohadilla trmica. Si tiene un dispositivo de [employer] Energy se puede quitar, Sempra Energy segn lo indicado por su mdico. Colquese una toalla entre la piel y la fuente de Freight forwarder. Si el mdico le indica que no se quite el dispositivo de HCA Inc se Passenger transport manager, coloque una  toalla entre el dispositivo de [place] y la fuente de Freight forwarder. Aplique calor durante 20 a 30 minutos. Retire la fuente de calor si la piel se pone de color rojo brillante. Esto es especialmente importante si no puede sentir dolor, calor o fro. Puede correr un riesgo mayor de sufrir quemaduras. Si se lo indican, aplique hielo sobre la zona afectada. Para hacer esto: Si tiene un dispositivo de ayuda que se puede quitar, quteselo segn lo indicado por su mdico. Ponga el hielo en una bolsa plstica. Coloque una toalla entre la piel y Therapist, nutritional. Si el mdico le indica que no se quite el dispositivo de HCA Inc se aplica hielo, coloque una toalla entre el dispositivo de [place] y la bolsa de hielo. Aplique el hielo durante 20 minutos, 2 o 3 veces por da. Mueva los dedos de las manos o de los pies con frecuencia para reducir la rigidez y la hinchazn. Cuando est sentado o acostado, levante (eleve) la zona lesionada por encima del nivel del corazn. Instrucciones generales Use los medicamentos de venta libre y los recetados solamente como se lo haya indicado el mdico. Mantenga un peso saludable. Siga las instrucciones de su mdico con respecto al control de su peso. No consuma ningn producto que contenga nicotina o tabaco, como cigarrillos, cigarrillos electrnicos y tabaco de Higher education careers adviser. Si necesita ayuda para dejar de fumar, consulte al [employer]. Use los dispositivos de A[employer]ren Corporation se lo haya indicado el mdico. Concurra a todas las visitas de seguimiento como se lo haya indicado el mdico. Esto es importante. Dnde buscar ms informacin Lockheed Martin of Arthritis and Musculoskeletal and Skin Diseases ([place] Artritis y [place] Musculoesquelticas y Insurance underwriter): www.niams.SouthExposed.es National Institute  on Aging ([place]): http://kim-miller.com/ American College of Rheumatology ([place]):  www.rheumatology.org Comunquese con un mdico si: Tiene enrojecimiento, hinchazn o sensacin de calor que empeora en una articulacin. Tiene fiebre y siente dolor en la articulacin o el msculo. Presenta una erupcin cutnea. Tiene dificultad para Cal[employer] Corporation cotidianas. Solicite ayuda de inmediato si: Siente dolor que empeora y no se alivia con los analgsicos. Resumen La artrosis es una clase de artritis que afecta el tejido que cubre los extremos de los huesos en las articulaciones (cartlago). Esta afeccin es causada por el desgaste del cartlago que cubre los extremos de [place]. Los sntomas principales de esta enfermedad son dolor, hinchazn y Advertising account executive. No hay cura para esta enfermedad, pero el tratamiento puede ayudar a Financial controller y Teacher, English as a foreign language el funcionamiento de Water engineer. Esta informacin no tiene Marine scientist el consejo del mdico. Asegrese de hacerle al mdico cualquier pregunta que tenga. Document Revised: 09/03/2019 Document Reviewed: 09/03/2019 Elsevier Patient Education  [place].

## 2022-01-05 NOTE — Assessment & Plan Note (Addendum)
He has not on pharmacologic treatment. Because elevated 10 years ASCVD risk (27%) and aortic atherosclerosis, he benefits from statins. Crestor 10 mg daily recommended. He has taken medication before and well tolerated.

## 2022-01-05 NOTE — Assessment & Plan Note (Addendum)
We discussed CV benefits from statins. Crestor 10 mg started today.

## 2022-01-05 NOTE — Assessment & Plan Note (Addendum)
Seborrheic dermatitis vs eczema. Symptoms are not frequent but it is very pruritic at times. Recommend topical steroid,Fluocinolone otic drops 1 gtt daily prn, some side effects discussed.

## 2022-01-05 NOTE — Assessment & Plan Note (Signed)
HgA1C 6.0 in 05/2021. Continue a healthy lifestyle for diabetes prevention. Further recommendation will be given according to hemoglobin A1c result.

## 2022-01-05 NOTE — Assessment & Plan Note (Addendum)
We discussed treatment options. Because also OA,Duloxetine 30 mg was recommended. Instructed about warning signs. F/U in 3-4 months, before if needed.

## 2022-01-05 NOTE — Progress Notes (Unsigned)
01/06/2022 Aurther Harlin [id number] 1946-01-21  Referring provider: Martinique, Betty G, MD Primary GI doctor: Dr. Ardis Hughs  ASSESSMENT AND PLAN:   History of adenomatous polyp of colon /2016 Colonoscopy which revealed a sigmoid TA polyp and internal hemorrhoids. Recommended repeat colonoscopy in 01/2020 due to hx of TA Polyp.  We have discussed the risks of bleeding, infection, perforation, medication reactions, and remote risk of death associated with colonoscopy. All questions were answered and the patient acknowledges these risk and wishes to proceed.  Barrett's esophagus without dysplasia 05/2018 EGD revealed HH, salmon colored mucosa. Path c/w Barrett's with intestinal metaplasia, no dysplasia or malignancy. Gastric biopsy positive for H Pylori.  He was treated with quadruple therapy and H Pylori stool in 08/2018 was negative.  05/2021 DUE for repeated EGD for Barrett's surveillance.  Start on the nexium once EVERY DAY Information given Will schedule for EGD  Esophageal dysphagia EGD to evaluate for structural abnormality, tumor, erosive/infectious esophagititis, and EOE.   If the EGD is negative can then proceed to barium swallow and/or esophageal manometry. Can do trial of PPI once daily I discussed risks of EGD with patient today, including risk of sedation, bleeding or perforation.  Patient provides understanding and gave verbal consent to proceed. Can consider barret's esophagus or esophageal manometry  Change in bowel habits After a cruise, add on fiber, increase water.    Patient Care Team: Martinique, Betty G, MD as PCP - General (Family Medicine) Harvie Junior, MD as Referring Physician (Specialist)  HISTORY OF PRESENT ILLNESS: 76 y.o. male referred by Martinique, Betty G, MD presents for consultation of colonoscopy. He has past medical history significant for hypothyroidism, fatty liver, hypertension, hyperlipidemia, anxiety, GERD, Barrett's esophagus without  dysplasia, personal history of colonic polyp and others listed below.  Patient previously followed with [facility], Dr. Mirian Capuchin Donnal Moat Marin Comment, last seen 05/21/2019. 01/2015 Colonoscopy which revealed a sigmoid TA polyp and internal hemorrhoids. Recommended repeat colonoscopy in 01/2020 due to hx of TA Polyp.   05/2018 EGD revealed HH, salmon colored mucosa. Path c/w Barrett's with intestinal metaplasia, no dysplasia or malignancy. Gastric biopsy positive for H Pylori.  He was treated with quadruple therapy and H Pylori stool in 08/2018 was negative.  05/2021 DUE for repeated EGD for Barrett's surveillance.  10/2017 AB US showed fatty liver.  The last few days he states he has not been having some constipation about 2 weeks.  No blood in the stool. Has started on new cholesterol medication x 2 weeks ago. He came back from cruise 2 weeks ago and eat more than normal.    Patient does complain of GERD, he is on nexium but not daily, takes it as needed when food that will affect it. Will rarely drink wine once every 2 months.  He has had trouble swallowing worsening x 2 years, slightly worse, will have to drink a lot of water, occ with feel some issues with liquids, no globulus sensation. He does not smoke.  Patient denies  nausea, vomiting, melena.  Patient denies change in bowel habits, constipation, diarrhea, hematochezia.  Denies changes in appetite, unintentional weight loss.  His mother was unable to swallow/eat but no colon cancer.   Lab Results  Component Value Date   TSH 0.72 05/13/2021   Wt Readings from Last 3 Encounters:  01/06/22 181 lb 9.6 oz (82.4 kg)  01/05/22 175 lb 4 oz (79.5 kg)  05/13/21 182 lb (82.6 kg)     Current Medications:  Current Outpatient Medications (Endocrine & Metabolic):    levothyroxine (SYNTHROID) 150 MCG tablet, Take 1 tablet (150 mcg total) by mouth daily.  Current Outpatient Medications (Cardiovascular):    amLODipine-olmesartan (AZOR) 5-40 MG tablet, Take 1  tablet by mouth daily.   rosuvastatin (CRESTOR) 10 MG tablet, Take 1 tablet (10 mg total) by mouth daily.  Current Outpatient Medications (Respiratory):    budesonide-formoterol (SYMBICORT) 160-4.5 MCG/ACT inhaler, Inhale 2 puffs into the lungs 2 (two) times daily.  Current Outpatient Medications (Analgesics):    allopurinol (ZYLOPRIM) 100 MG tablet, Take 1 tablet (100 mg total) by mouth daily.   Current Outpatient Medications (Other):    DULoxetine (CYMBALTA) 30 MG capsule, Take 1 capsule (30 mg total) by mouth daily.   esomeprazole (NEXIUM) 40 MG capsule, Take 40 mg by mouth daily at 12 noon.   Fluocinolone Acetonide 0.01 % OIL, Place 1 drop in ear(s) daily as needed for up to 14 days. Right ear for itching.  Medical History:  Past Medical History:  Diagnosis Date   Allergy    Arthritis    Asthma    Hypertension    Hypogonadism male    Thyroid disease    Hypothyroidism   Varicose veins    Allergies:  Allergies  Allergen Reactions   Aspirin Anaphylaxis    Angioedema,    Penicillins Anaphylaxis   Ketorolac Tromethamine      Surgical History:  He  has a past surgical history that includes Varicose vein surgery. Family History:  His family history includes Arthritis in his mother and paternal grandmother; Asthma in his mother; Cancer in his brother and sister; Heart attack in his father; Heart disease in his father; Other in his mother. Social History:   reports that he has never smoked. He has never used smokeless tobacco. He reports current alcohol use of about 9.0 standard drinks per week. He reports that he does not use drugs.  REVIEW OF SYSTEMS  : All other systems reviewed and negative except where noted in the History of Present Illness.   PHYSICAL EXAM: BP 118/60   Pulse 72   Wt 181 lb 9.6 oz (82.4 kg)   BMI 28.78 kg/m  General:   Pleasant, well developed male in no acute distress Head:   Normocephalic and atraumatic. Eyes:  sclerae anicteric,conjunctive  pink  Heart:   regular rate and rhythm Pulm:  Clear anteriorly; no wheezing Abdomen:   Soft, Obese AB, Active bowel sounds. No tenderness . , No organomegaly appreciated. Rectal: Not evaluated Extremities:  Without edema. Msk: Symmetrical without gross deformities. Peripheral pulses intact.  Neurologic:  Alert and  oriented x4;  No focal deficits.  Skin:   Dry and intact without significant lesions or rashes. Psychiatric:  Cooperative. Normal mood and affect.    Vladimir Crofts, PA-C 11:04 AM

## 2022-01-05 NOTE — Assessment & Plan Note (Addendum)
We discussed diagnosis, prognosis, and treatment options. Continue fish oil. After discussion of some side effects, he agrees with trying duloxetine 30 mg daily. Low impact exercise recommended. Follow-up in 3 to 4 months, before if needed.

## 2022-01-06 ENCOUNTER — Ambulatory Visit: Payer: Medicare Other | Admitting: Physician Assistant

## 2022-01-06 ENCOUNTER — Encounter: Payer: Self-pay | Admitting: Physician Assistant

## 2022-01-06 VITALS — BP 118/60 | HR 72 | Wt 181.6 lb

## 2022-01-06 DIAGNOSIS — R1319 Other dysphagia: Secondary | ICD-10-CM

## 2022-01-06 DIAGNOSIS — R194 Change in bowel habit: Secondary | ICD-10-CM | POA: Diagnosis not present

## 2022-01-06 DIAGNOSIS — Z86010 Personal history of colonic polyps: Secondary | ICD-10-CM | POA: Diagnosis not present

## 2022-01-06 DIAGNOSIS — K2270 Barrett's esophagus without dysplasia: Secondary | ICD-10-CM | POA: Diagnosis not present

## 2022-01-06 DIAGNOSIS — K219 Gastro-esophageal reflux disease without esophagitis: Secondary | ICD-10-CM | POA: Diagnosis not present

## 2022-01-06 MED ORDER — NA SULFATE-K SULFATE-MG SULF 17.5-3.13-1.6 GM/177ML PO SOLN: 1.0000 | Freq: Once | ORAL | 0 refills | Status: AC

## 2022-01-06 NOTE — Patient Instructions (Addendum)
Si tiene 66 aos o ms, su ndice de [employer] corporal debe estar entre 23 y 44. Su ndice de masa corporal es de 28,78 kg/m. Si esto est fuera del rango mencionado anteriormente, considere hacer un seguimiento con su proveedor de Midwife. ________________________________________________________  [employer] de [place] [facility] desean alentarlo a que use MYCHART para comunicarse con los proveedores para solicitudes o preguntas que no sean urgentes. Debido a los Astronomer de espera en el telfono, enviar un mensaje a su proveedor por [employer] puede ser una forma ms rpida y eficiente de obtener una respuesta. Espere 48 horas hbiles para obtener [employer]. Recuerde que esto es para solicitudes no urgentes. _______________________________________________________  Se le ha programado una endoscopia y [place] colonoscopia. Siga las instrucciones escritas que se le dieron en su visita de hoy. Recoja sus suministros de preparacin en la farmacia dentro de los prximos 1 a 3 das. Si [place] inhaladores (aunque solo sea necesario), trigalos el da de su procedimiento.  Seguimiento pendiente de los resultados de tu Endoscopa/Colonoscopa.  Gracias por confiarme su atencin y [place].  Vicie Mutters, PA-C  Toma un pas de nexium cada dia antes comida.   Esfago de Barrett Barrett's Esophagus  El esfago de Barrett aparece cuando el tejido que recubre el esfago cambia o se daa. El esfago es el tubo que transporta los alimentos desde la garganta hasta el [place]. Cuando aparece el esfago de Barrett, las clulas que recubren el esfago son reemplazadas por clulas similares al revestimiento de los intestinos (metaplasia intestinal). El esfago de Barrett en s puede no causar sntomas. Sin embargo, Regulatory affairs officer con esfago de Barrett tambin tienen enfermedad de reflujo gastroesofgico (ERGE), que puede causar sntomas tales como acidez estomacal. Con el tiempo, algunas  personas con esta afeccin pueden Animal nutritionist de esfago. El tratamiento puede incluir medicamentos, procedimientos para destruir las clulas anormales o [place]. Cules son las causas? Se desconoce la causa exacta de esta afeccin. En algunos casos, se produce a partir de [employer] revestimiento del esfago a causa de la enfermedad de reflujo gastroesofgico (ERGE). La ERGE ocurre cuando los cidos estomacales retroceden desde el estmago al esfago. Los sntomas frecuentes de ERGE pueden causar metaplasia intestinal o cambios a nivel celular (disfasia). Qu incrementa el riesgo? Es ms probable que tenga esta afeccin si: Tiene ERGE. Es varn. Tiene ascendencia europea. Tiene obesidad. Tiene ms de 50 aos. Tiene una hernia de hiato. Esta es una afeccin que se caracteriza porque parte del estmago se protruye en el pecho. Fuma. Cules son los signos o sntomas? Con frecuencia, las personas que tienen esfago de Barrett no presentan sntomas. Sin embargo, Regulatory affairs officer que padecen esta afeccin tambin tienen [place]. Los sntomas de [employer] pueden incluir lo siguiente: Merchant navy officer. Dificultad para tragar. Tos seca. Cmo se diagnostica? Esta afeccin se puede diagnosticar en funcin de lo siguiente: El resultado de una endoscopa de la parte superior del tubo gastrointestinal. Para este examen, se introduce hasta el esfago un tubo delgado y flexible con Ardelia Mems luz y una cmara en el extremo (endoscopio). El mdico puede observar el interior del esfago durante este procedimiento. Los resultados de una biopsia. Para este procedimiento, se extraen varias muestras de tejido (biopsia) del esfago para observarlas con el microscopio. Luego, se analizan en busca de metaplasia o disfasia intestinal. Cmo se trata? El tratamiento de esta afeccin puede incluir lo siguiente: Medicamentos (inhibidores de la bomba de protones o IBP) para reducir o Hydrographic surveyor. Exmenes  endoscpicos  peridicos para asegurarse de que no aparezca cncer. Un procedimiento o una ciruga para la disfasia. Estos pueden incluir: La extirpacin o destruccin de las clulas anormales. La extirpacin de parte del esfago. Siga estas instrucciones en su casa: Comida y bebida Coma ms frutas y verduras. Evite los alimentos grasosos. Haga comidas pequeas y frecuentes Medical sales representative de comidas abundantes. No consuma los alimentos que le causan acidez estomacal. Estos alimentos incluyen: Caf y bebidas alcohlicas. Tomates y comidas hechas con tomates. Comidas grasosas o condimentadas. Chocolate y [place]. No bebas alcohol. Indicaciones generales Use los medicamentos de venta libre y los recetados solamente como se lo haya indicado el mdico. No consuma ningn producto que contenga nicotina o tabaco, como cigarrillos, cigarrillos electrnicos y tabaco de Higher education careers adviser. Si necesita ayuda para dejar de fumar, consulte al mdico. Si el mdico lo est tratando por ERGE, tome los medicamentos y siga todas las instrucciones como se lo haya indicado. Concurra a todas las visitas de seguimiento como se lo haya indicado el mdico. Esto es importante. Comunquese con un mdico si: Tiene acidez estomacal o sntomas de ERGE. Tiene dificultad para tragar. Solicite ayuda de inmediato si: Electronics engineer. No puede tragar. Vomita sangre de color rojo brillante o una sustancia similar al poso del caf. La materia fecal (heces) es de color rojo brillante u oscura. Estos sntomas pueden representar un problema grave que constituye Engineer, maintenance (IT). No espere a ver si los sntomas desaparecen. Solicite atencin mdica de inmediato. Comunquese con el servicio de emergencias de su localidad (911 en los [place]). No conduzca por sus propios medios [employer]. Resumen El esfago de Barrett aparece cuando el tejido que recubre el esfago cambia o se daa. El diagnstico del esfago de Barrett se  puede realizar por medio de una endoscopa gastrointestinal superior y Ardelia Mems biopsia. El tratamiento puede incluir medicamentos, procedimientos para extirpar las clulas anormales o [place]. [place] mdico respecto de qu comer y beber, qu medicamentos tomar y cundo Arts development officer. Esta informacin no tiene Marine scientist el consejo del mdico. Asegrese de hacerle al mdico cualquier pregunta que tenga. Document Revised: 11/09/2019 Document Reviewed: 11/09/2019 Elsevier Patient Education  [place], en adultos Constipation, Adult El estreimiento se produce cuando una persona tiene problemas para defecar (hacer sus deposiciones). Cuando tiene [employer], el nmero de deposiciones es inferior a 3 veces por semana. Las deposiciones (heces) tambin pueden ser secas, duras o ms voluminosas de lo normal. Siga estas instrucciones en su casa: Comida y bebida  Consuma alimentos con alto contenido de [place], por ejemplo: Frutas y verduras frescas. Cereales integrales. Frijoles. Consuma menos alimentos bajos en fibra y con alto contenido de grasas y Location manager, como: Papas fritas. Hamburguesas. Galletas dulces. Caramelos. Gaseosas. Beba suficiente lquido para Contractor pis (orina) de color amarillo plido. Instrucciones generales Haga actividad fsica con regularidad o segn las indicaciones del mdico. Intente practicar 150 minutos de actividad fsica por semana. Vaya al bao cuando sienta la necesidad de defecar. No se aguante las ganas. Use los medicamentos de venta libre y los recetados solamente como se lo haya indicado el mdico. Estos incluyen los suplementos de [place]. Cuando est defecando: Respire profundamente mientras relaja la parte inferior del vientre (abdomen). Relaje el suelo plvico. El suelo plvico est formado por un grupo de msculos que sostienen el recto, la vejiga y los intestinos (as como el tero en las  mujeres). Controle su afeccin para  detectar cualquier cambio. Visite al mdico si advierte lo indicado. Concurra a todas las visitas de seguimiento como se lo haya indicado el mdico. Esto es importante. Comunquese con un mdico si: Su dolor empeora. Tiene fiebre. No ha defecado por 4 das. Vomita. No tiene hambre. Pierde peso. Tiene sangrado por la abertura entre las nalgas (ano). Las deposiciones son delgadas como un lpiz. Solicite ayuda de inmediato si: Jaclynn Guarneri, y los sntomas empeoran de repente. Tiene prdida de materia fecal u observa [employer]. Siente el vientre ms duro o ms grande de lo normal (hinchado). Siente un dolor muy intenso en el vientre. Se siente mareado o se desmaya. Resumen El estreimiento se produce cuando una persona defeca menos de 3 veces a la semana, tiene problemas para defecar o las heces son secas, duras o ms grandes que lo normal. Consuma alimentos con un alto contenido de [place]. Beba suficiente lquido para Contractor pis (orina) de color amarillo plido. Use los medicamentos de venta libre y los recetados solamente como se lo haya indicado el mdico. Estos incluyen los suplementos de [place]. Esta informacin no tiene Marine scientist el consejo del mdico. Asegrese de hacerle al mdico cualquier pregunta que tenga. Document Revised: 08/31/2019 Document Reviewed: 08/31/2019 Elsevier Patient Education  [place].

## 2022-01-06 NOTE — Progress Notes (Signed)
I agree with the above note, plan 

## 2022-01-11 ENCOUNTER — Telehealth: Payer: Self-pay | Admitting: Family Medicine

## 2022-01-11 DIAGNOSIS — I10 Essential (primary) hypertension: Secondary | ICD-10-CM

## 2022-01-11 MED ORDER — AMLODIPINE-OLMESARTAN 5-40 MG PO TABS: 1.0000 | ORAL_TABLET | Freq: Every day | ORAL | 3 refills | Status: DC

## 2022-01-11 MED ORDER — LEVOTHYROXINE SODIUM 150 MCG PO TABS: 150.0000 ug | ORAL_TABLET | Freq: Every day | ORAL | 3 refills | Status: DC

## 2022-01-11 NOTE — Telephone Encounter (Signed)
Patient arrived this morning requesting a refill of his medications. Patient would like a 3 month supply of Amlodipine and Levothyroxine so that he does not have to keep coming in to request refills.  Please advise. (IC)

## 2022-02-22 ENCOUNTER — Encounter: Payer: Self-pay | Admitting: Gastroenterology

## 2022-03-01 ENCOUNTER — Encounter: Payer: Medicare Other | Admitting: Gastroenterology

## 2022-03-01 DIAGNOSIS — Z1211 Encounter for screening for malignant neoplasm of colon: Secondary | ICD-10-CM | POA: Diagnosis not present

## 2022-03-01 DIAGNOSIS — D122 Benign neoplasm of ascending colon: Secondary | ICD-10-CM | POA: Diagnosis not present

## 2022-03-01 DIAGNOSIS — K449 Diaphragmatic hernia without obstruction or gangrene: Secondary | ICD-10-CM | POA: Diagnosis not present

## 2022-03-01 DIAGNOSIS — K635 Polyp of colon: Secondary | ICD-10-CM | POA: Diagnosis not present

## 2022-03-01 DIAGNOSIS — K2270 Barrett's esophagus without dysplasia: Secondary | ICD-10-CM | POA: Diagnosis not present

## 2022-03-01 DIAGNOSIS — D123 Benign neoplasm of transverse colon: Secondary | ICD-10-CM | POA: Diagnosis not present

## 2022-04-19 ENCOUNTER — Telehealth: Payer: Self-pay | Admitting: Family Medicine

## 2022-04-19 DIAGNOSIS — I10 Essential (primary) hypertension: Secondary | ICD-10-CM

## 2022-04-19 MED ORDER — LEVOTHYROXINE SODIUM 150 MCG PO TABS: 150.0000 ug | ORAL_TABLET | Freq: Every day | ORAL | 3 refills | Status: DC

## 2022-04-19 MED ORDER — AMLODIPINE-OLMESARTAN 5-40 MG PO TABS: 1.0000 | ORAL_TABLET | Freq: Every day | ORAL | 3 refills | Status: DC

## 2022-04-19 NOTE — Telephone Encounter (Signed)
Rx's sent in. °

## 2022-04-19 NOTE — Telephone Encounter (Signed)
Patient stopped by because he would like refill on amLODipine-olmesartan (AZOR) 5-40 MG tablet  levothyroxine (SYNTHROID) 150 MCG tablet   Patient would like these to continue being a 90 day supply  Please send to    [facility] # [id number] [place] Phone:  [phone]  Fax:  [phone]       Please advise

## 2022-05-03 DIAGNOSIS — G4733 Obstructive sleep apnea (adult) (pediatric): Secondary | ICD-10-CM | POA: Diagnosis not present

## 2022-05-05 ENCOUNTER — Other Ambulatory Visit: Payer: Self-pay | Admitting: Family Medicine

## 2022-05-05 DIAGNOSIS — J4520 Mild intermittent asthma, uncomplicated: Secondary | ICD-10-CM

## 2022-05-05 NOTE — Progress Notes (Unsigned)
HPI: [name] is a 76 y.o. male, who is here today for follow up. He was last seen on 01/05/22. No new problems since his last visit.  Hypertension:  Medications:Amlodipine-Olmesartan 5-40 mg daily. Negative for unusual or severe headache, visual changes, exertional chest pain, focal weakness, or edema.  Lab Results  Component Value Date   CREATININE 0.91 01/05/2022   BUN 24 (H) 01/05/2022   NA 137 01/05/2022   K 4.3 01/05/2022   CL 100 01/05/2022   CO2 29 01/05/2022   Generalized jint pain: Duloxetine 30 mg daily started last visit.He feels like medication helps. Medication has also helped with anxiety, which she reported last visit.  Still having generalized arthralgias but not bad and exacerbated by certain activities and intense exercise. No edema or erythema or joints.  Last visit he was c/o right foot joint pain that was suggestive for gout.  Last visit Allopurinol 100 mg daily was recommended, took medication for 30 days. Right foot pain has not reoccurred.  Lab Results  Component Value Date   LABURIC 8.5 (H) 01/05/2022   -Insomnia: He tried Melatonin but it did not help. Sleeping about 4 hours. He goes to bed around 10-10: 15 pm, no problems falling asleep. Wakes up around 3 am and not able to go back tp sleep.  -Left hearing loss, constant, for the past 2 months. Hx of noise exposure. Turning TV volume higher. Bilateral tinnitus for a while.  -Nocturia x 3-4 , this has happened for years. Stopped drinking water after 6-7 pm and has improved but not resolved. Negative for dysuria,increased urinary frequency, gross hematuria,or decreased urine output.  He is concerned about 10 Lb wt lost in 2 months. He feels "good." He is exercising regularly, 5 times per week he goes to the gym, walking and jogging. Follows a healthful diet, stopped bread and cheese intake and decreased carbs intake. Asthma: SOB with exercise,he uses his Symbicort 160-4.5 mcg  before exercising. Negative for cough or wheezing.  Hypothyroidism on Levothyroxine 150 mcg daily. Last TSH 0.7 in 05/2021.  HLD: He is on Crestor 10 mg daily. In general he is tolerating medication well.  Lab Results  Component Value Date    CHOL 204 (H) 05/13/2021    HDL 60.50 05/13/2021    LDLCALC 124 (H) 05/13/2021    TRIG 93.0 05/13/2021    CHOLHDL 3    Review of Systems  Constitutional:  Positive for fatigue. Negative for activity change, appetite change and fever.  HENT:  Negative for nosebleeds, sore throat and trouble swallowing.   Eyes:  Negative for redness and visual disturbance.  Gastrointestinal:  Negative for abdominal pain, nausea and vomiting.  Endocrine: Negative for cold intolerance, heat intolerance, polydipsia, polyphagia and polyuria.  Musculoskeletal:  Negative for gait problem.  Skin:  Negative for rash.  Neurological:  Negative for syncope and facial asymmetry.  Psychiatric/Behavioral:  Positive for sleep disturbance. Negative for confusion.   Rest see pertinent positives and negatives per HPI.  Current Outpatient Medications on File Prior to Visit  Medication Sig Dispense Refill   amLODipine-olmesartan (AZOR) 5-40 MG tablet Take 1 tablet by mouth daily. 90 tablet 3   esomeprazole (NEXIUM) 40 MG capsule Take 40 mg by mouth daily at 12 noon.     levothyroxine (SYNTHROID) 150 MCG tablet Take 1 tablet (150 mcg total) by mouth daily. 90 tablet 3   rosuvastatin (CRESTOR) 10 MG tablet Take 1 tablet (10 mg total) by mouth daily. 90 tablet 3  SYMBICORT 160-4.5 MCG/ACT inhaler INHALE 2 PUFFS INTO THE LUNGS BY MOUTH TWICE A DAY 10.2 g 0   No current facility-administered medications on file prior to visit.   Past Medical History:  Diagnosis Date   Allergy    Arthritis    Asthma    Hypertension    Hypogonadism male    Thyroid disease    Hypothyroidism   Varicose veins    Allergies  Allergen Reactions   Aspirin Anaphylaxis    Angioedema,     Penicillins Anaphylaxis   Ketorolac Tromethamine     Social History   Socioeconomic History   Marital status: Married    Spouse name: Not on file   Number of children: 2   Years of education: Not on file   Highest education level: Not on file  Occupational History   Not on file  Tobacco Use   Smoking status: Never   Smokeless tobacco: Never  Vaping Use   Vaping Use: Never used  Substance and Sexual Activity   Alcohol use: Yes    Alcohol/week: 9.0 standard drinks of alcohol    Types: 5 Cans of beer, 4 Shots of liquor per week   Drug use: No   Sexual activity: Not Currently  Other Topics Concern   Not on file  Social History Narrative   Not on file   Social Determinants of Health   Financial Resource Strain: Not on file  Food Insecurity: Not on file  Transportation Needs: Not on file  Physical Activity: Not on file  Stress: Not on file  Social Connections: Not on file   Vitals:   05/07/22 0828  BP: 118/70  Pulse: 68  Resp: 12  Temp: 98.2 F (36.8 C)  SpO2: 91%   Wt Readings from Last 3 Encounters:  05/07/22 168 lb 4 oz (76.3 kg)  01/06/22 181 lb 9.6 oz (82.4 kg)  01/05/22 175 lb 4 oz (79.5 kg)  Body mass index is 26.66 kg/m.  Physical Exam Vitals and nursing note reviewed.  Constitutional:      General: He is not in acute distress.    Appearance: He is well-developed.  HENT:     Head: Normocephalic and atraumatic.     Right Ear: External ear normal.     Left Ear: Hearing, tympanic membrane, ear canal and external ear normal.     Ears:     Comments: Cerumen impaction right ear.    Mouth/Throat:     Mouth: Mucous membranes are moist.     Pharynx: Oropharynx is clear.  Eyes:     Conjunctiva/sclera: Conjunctivae normal.  Cardiovascular:     Rate and Rhythm: Normal rate and regular rhythm.     Pulses:          Dorsalis pedis pulses are 2+ on the right side and 2+ on the left side.     Heart sounds: No murmur heard. Pulmonary:     Effort: Pulmonary  effort is normal. No respiratory distress.     Breath sounds: Examination of the right-lower field reveals wheezing. Wheezing present. No rhonchi or rales.  Abdominal:     Palpations: Abdomen is soft. There is no hepatomegaly or mass.     Tenderness: There is no abdominal tenderness.  Lymphadenopathy:     Cervical: No cervical adenopathy.  Skin:    General: Skin is warm.     Findings: No erythema or rash.  Neurological:     Mental Status: He is alert and oriented to person,  place, and time.     Cranial Nerves: No cranial nerve deficit.     Gait: Gait normal.  Psychiatric:     Comments: Well groomed, good eye contact.   ASSESSMENT AND PLAN:  Clarence Sanders was seen today for follow-up.  Diagnoses and all orders for this visit: Orders Placed This Encounter  Procedures   DG Chest 2 View   TSH   Comprehensive metabolic panel   PSA   Urinalysis, Routine w reflex microscopic   Lipid panel   CBC   Lab Results  Component Value Date   WBC 6.1 05/07/2022   HGB 15.1 05/07/2022   HCT 45.0 05/07/2022   MCV 89.5 05/07/2022   PLT 287.0 05/07/2022   Lab Results  Component Value Date   CREATININE 0.84 05/07/2022   BUN 17 05/07/2022   NA 139 05/07/2022   K 5.2 No hemolysis seen (H) 05/07/2022   CL 102 05/07/2022   CO2 28 05/07/2022   Lab Results  Component Value Date   ALT 26 05/07/2022   AST 19 05/07/2022   ALKPHOS 64 05/07/2022   BILITOT 0.9 05/07/2022    Lab Results  Component Value Date   TSH 0.02 (L) 05/07/2022   Lab Results  Component Value Date   PSA 4.22 (H) 05/07/2022   PSA 3.30 05/13/2021   PSA 2.32 11/14/2007   Lab Results  Component Value Date   CHOL 208 (H) 05/07/2022   HDL 63.30 05/07/2022   LDLCALC 133 (H) 05/07/2022   TRIG 59.0 05/07/2022   CHOLHDL 3 05/07/2022   Benign prostatic hyperplasia with urinary frequency Urinary symptoms, nocturia, has been stable for years. We discussed differential Dx. He is not having acute urinary symptoms. For now we  decided to hold on pharmacologic treatment. Further recommendations according to PSA and UA.  Insomnia, unspecified type Melatonin did not help. Option discussed, he agrees with trying Doxepin, starting with 3 mg (0.3 ml) and titrating to 6 mg and 10 mg if needed. Continue good sleep hygiene. We discussed risk of med interaction with Duloxetine but will keep both at low dose.  -     doxepin (SINEQUAN) 10 MG/ML solution; Take 0.3-1 mLs (3-10 mg total) by mouth at bedtime.  Hyperlipidemia, unspecified hyperlipidemia type Continue Rosuvastatin 10 mg daily and low fat diet.  DOE (dyspnea on exertion) We discussed possible etiologies, most likely caused by asthma. Recommend using his Symbicort 160-4.5 mcg bid (swish after use) and Albuterol inh before exercise. Localized mild wheezing on auscultation, CXR ordered today. Instructed about warning signs.  Hearing loss of right ear due to cerumen impaction After verbal consent right ear lavage performed, he tolerated procedure well,hearing improved. Recommend avoiding Q tips. Hearing Screening   '500Hz'$  '1000Hz'$  '2000Hz'$  '4000Hz'$   Right ear Fail Fail Pass Pass  Left ear       Weight loss observed on examination Could be caused by dietary changes. We will continue monitoring. Labs obtain today as well as CXR. Colonoscopy reported as current, he also had EGD, 03/01/2022 in [place]. EGD: Barrett's mucosa and hiatal hernia.Colonoscopy:2 Polyps Removed by Cold Biopsy and 3 Polyps Removed by Snare Polypectomy. EGD and colonoscopy in 3 year recommended.  Hypothyroidism (acquired) Problem has been adequately controlled. Continue same dose of Levothyroxine. TSH ordered today.  Hyperuricemia Stopped Allopurinol, not having signs of gout at this time, so continue low purine iet. If right MTP joint starts hurting again, we consider resuming Allopurinol.  Generalized osteoarthritis of multiple sites Duloxetine 30 mg has helped. For  now no changes in  management. Doxepin added today for sleep. Low impact exercise.  Essential (primary) hypertension BP adequately controlled. Continue Amlodipine-Olmesartan 5-40 mg daily and low salt diet. Eye exam is current.  I spent a total of 43 minutes in both face to face and non face to face activities for this visit on the date of this encounter. During this time history was obtained and documented, examination was performed, prior labs reviewed, and assessment/plan discussed.  Return in about 4 months (around 09/06/2022).  Annalina Needles G. [place], MD  [facility]. [place] office.

## 2022-05-07 ENCOUNTER — Ambulatory Visit (INDEPENDENT_AMBULATORY_CARE_PROVIDER_SITE_OTHER): Payer: Medicare Other

## 2022-05-07 ENCOUNTER — Ambulatory Visit (INDEPENDENT_AMBULATORY_CARE_PROVIDER_SITE_OTHER): Payer: Medicare Other | Admitting: Family Medicine

## 2022-05-07 ENCOUNTER — Encounter: Payer: Self-pay | Admitting: Family Medicine

## 2022-05-07 VITALS — BP 118/70 | HR 68 | Temp 98.2°F | Resp 12 | Ht 66.61 in | Wt 168.2 lb

## 2022-05-07 DIAGNOSIS — G4700 Insomnia, unspecified: Secondary | ICD-10-CM | POA: Diagnosis not present

## 2022-05-07 DIAGNOSIS — E785 Hyperlipidemia, unspecified: Secondary | ICD-10-CM

## 2022-05-07 DIAGNOSIS — R350 Frequency of micturition: Secondary | ICD-10-CM

## 2022-05-07 DIAGNOSIS — R062 Wheezing: Secondary | ICD-10-CM | POA: Diagnosis not present

## 2022-05-07 DIAGNOSIS — H6121 Impacted cerumen, right ear: Secondary | ICD-10-CM | POA: Diagnosis not present

## 2022-05-07 DIAGNOSIS — E039 Hypothyroidism, unspecified: Secondary | ICD-10-CM

## 2022-05-07 DIAGNOSIS — R634 Abnormal weight loss: Secondary | ICD-10-CM

## 2022-05-07 DIAGNOSIS — R0600 Dyspnea, unspecified: Secondary | ICD-10-CM | POA: Diagnosis not present

## 2022-05-07 DIAGNOSIS — I10 Essential (primary) hypertension: Secondary | ICD-10-CM | POA: Diagnosis not present

## 2022-05-07 DIAGNOSIS — E790 Hyperuricemia without signs of inflammatory arthritis and tophaceous disease: Secondary | ICD-10-CM | POA: Diagnosis not present

## 2022-05-07 DIAGNOSIS — N401 Enlarged prostate with lower urinary tract symptoms: Secondary | ICD-10-CM | POA: Diagnosis not present

## 2022-05-07 DIAGNOSIS — M159 Polyosteoarthritis, unspecified: Secondary | ICD-10-CM | POA: Diagnosis not present

## 2022-05-07 DIAGNOSIS — R0609 Other forms of dyspnea: Secondary | ICD-10-CM

## 2022-05-07 LAB — COMPREHENSIVE METABOLIC PANEL
ALT: 26 U/L (ref 0–53)
AST: 19 U/L (ref 0–37)
Albumin: 4.4 g/dL (ref 3.5–5.2)
Alkaline Phosphatase: 64 U/L (ref 39–117)
BUN: 17 mg/dL (ref 6–23)
CO2: 28 mEq/L (ref 19–32)
Calcium: 9.6 mg/dL (ref 8.4–10.5)
Chloride: 102 mEq/L (ref 96–112)
Creatinine, Ser: 0.84 mg/dL (ref 0.40–1.50)
GFR: 85.2 mL/min (ref >60.00)
Glucose, Bld: 87 mg/dL (ref 70–99)
Potassium: 5.2 mEq/L — ABNORMAL HIGH (ref 3.5–5.1)
Sodium: 139 mEq/L (ref 135–145)
Total Bilirubin: 0.9 mg/dL (ref 0.2–1.2)
Total Protein: 7.1 g/dL (ref 6.0–8.3)

## 2022-05-07 LAB — TSH: TSH: 0.02 u[IU]/mL — ABNORMAL LOW (ref 0.35–5.50)

## 2022-05-07 LAB — CBC
HCT: 45 % (ref 39.0–52.0)
Hemoglobin: 15.1 g/dL (ref 13.0–17.0)
MCHC: 33.6 g/dL (ref 30.0–36.0)
MCV: 89.5 fl (ref 78.0–100.0)
Platelets: 287 10*3/uL (ref 150.0–400.0)
RBC: 5.02 Mil/uL (ref 4.22–5.81)
RDW: 13.4 % (ref 11.5–15.5)
WBC: 6.1 10*3/uL (ref 4.0–10.5)

## 2022-05-07 LAB — LIPID PANEL
Cholesterol: 208 mg/dL — ABNORMAL HIGH (ref 0–200)
HDL: 63.3 mg/dL (ref >39.00)
LDL Cholesterol: 133 mg/dL — ABNORMAL HIGH (ref 0–99)
NonHDL: 145.02
Total CHOL/HDL Ratio: 3
Triglycerides: 59 mg/dL (ref 0.0–149.0)
VLDL: 11.8 mg/dL (ref 0.0–40.0)

## 2022-05-07 LAB — URINALYSIS, ROUTINE W REFLEX MICROSCOPIC
Bilirubin Urine: NEGATIVE
Hgb urine dipstick: NEGATIVE
Ketones, ur: NEGATIVE
Leukocytes,Ua: NEGATIVE
Nitrite: NEGATIVE
RBC / HPF: NONE SEEN (ref 0–?)
Specific Gravity, Urine: 1.01 (ref 1.000–1.030)
Total Protein, Urine: NEGATIVE
Urine Glucose: NEGATIVE
Urobilinogen, UA: 0.2 (ref 0.0–1.0)
pH: 6.5 (ref 5.0–8.0)

## 2022-05-07 LAB — PSA: PSA: 4.22 ng/mL — ABNORMAL HIGH (ref 0.10–4.00)

## 2022-05-07 MED ORDER — DOXEPIN HCL 10 MG/ML PO CONC: 3.0000 mg | Freq: Every day | ORAL | 1 refills | Status: DC

## 2022-05-07 NOTE — Assessment & Plan Note (Signed)
Problem has been adequately controlled. Continue same dose of Levothyroxine. TSH ordered today.

## 2022-05-07 NOTE — Assessment & Plan Note (Signed)
Duloxetine 30 mg has helped. For now no changes in management. Doxepin added today for sleep. Low impact exercise.

## 2022-05-07 NOTE — Patient Instructions (Addendum)
A few things to remember from today's visit:  Generalized osteoarthritis of multiple sites  Essential (primary) hypertension - Plan: Comprehensive metabolic panel, CBC  Hyperuricemia  Benign prostatic hyperplasia with urinary frequency - Plan: PSA, Urinalysis, Routine w reflex microscopic  Insomnia, unspecified type - Plan: doxepin (SINEQUAN) 10 MG/ML solution  Hypothyroidism (acquired) - Plan: TSH  Hyperlipidemia, unspecified hyperlipidemia type - Plan: Lipid panel  DOE (dyspnea on exertion) - Plan: DG Chest 2 View  Hearing loss of right ear due to cerumen impaction  Weight loss observed on examination  Tenemos que ser cuidadosos con Duloxetin y Doxepin, puede haber interaccin. La frecuencia en la orine es causada lo ms posible por la prstata, por ahora tratemos de mejorar el sueno antes de aadir ms medicamentos. Doxepin 3 mg 45 min antes de irse a la cam, puee aumentar la dossis a 6 mg (0.6 ml) y 10 mg (1 ml).  La disminucin de la audicin en el odo derecho puede ser por cera o edad. Considere evaluacin con Scientist, physiological.  Use el symbicor 2 veces al dia.  No cambios en el resto de sus medicamentos.  Prdida auditiva Hearing Loss La prdida de la audicin es la prdida total o parcial de la capacidad de or. Puede ser transitoria o Radene Ou, y ocurrir en uno o en ambos odos. Hay dos tipos de prdida de la audicin. Puede tener un solo tipo o ambos. Su problema puede estar relacionado con lo siguiente: [place] nervios de la audicin (prdida [place] neurosensorial). Este tipo de prdida de la audicin es ms probable que sea [place]. Un audfono es a Arboriculturist. La llegada del sonido al odo interno (prdida [place] conductiva). Este tipo de prdida de la audicin por lo general puede tratarse en forma mdica o [place]. Se necesita atencin mdica para tratar correctamente la prdida [place] y para evitar que la afeccin empeore. Es  posible recuperar la audicin parcial o totalmente, en funcin de la causa de la prdida [place] y de su gravedad. En algunos casos la prdida auditiva es permanente. Cules son las causas? Las causas frecuentes de la prdida auditiva incluyen lo siguiente: Exceso de cerumen en el conducto auditivo externo. Infeccin del conducto auditivo externo o del odo medio. Lquido en el odo medio. Lesin en el odo o en la zona alrededor del odo. Un objeto atascado en el odo. Antecedentes de exposicin prolongada a ruidos fuertes, Programmer, systems. Las causas menos frecuentes de la prdida auditiva incluyen lo siguiente: Tumores en el odo. Infecciones bacterianas o virales, como meningitis. Un orificio en la membrana del tmpano (membrana del tmpano perforada). Problemas en el nervio auditivo que enva las seales entre el cerebro y el odo. Ciertos medicamentos. Cules son los signos o sntomas? Los sntomas de esta afeccin pueden incluir los siguientes: Dificultad para Product manager diferencia entre los sonidos. Dificultad para seguir una conversacin cuando hay ruido ambiental. Ausencia de respuesta a los ruidos del entorno. Esto puede ser ms notorio cuando no hay respuesta a los ruidos inesperados. Necesidad de subir el volumen del Development worker, community, la radio u otros dispositivos. Pitidos en el odo. Mareos. Cmo se diagnostica? Esta afeccin se diagnostica en funcin de lo siguiente: Un examen fsico. Una prueba de audicin Lorel [place]). Un especialista en audicin (audilogo) realizar la prueba. Pruebas de diagnstico por imgenes, como Health visitor (RM) o una exploracin por tomografa computarizada (TC). Es posible que lo deriven a un especialista en garganta, Doran Durand y odo (otorrinolaringlogo). Cmo se trata? El  tratamiento para la prdida de la audicin incluye lo siguiente: Extraccin del cerumen. Medicamentos para tratar o prevenir una infeccin  (antibiticos). Medicamentos para reducir la inflamacin (corticoesteroides). Audfonos o dispositivos auditivos de asistencia. Siga estas indicaciones en su casa: Si le recetaron un antibitico, tmelo como se lo haya indicado el mdico. No deje de tomar el antibitico aunque comience a sentirse mejor. Use los medicamentos de venta libre y los recetados solamente como se lo haya indicado el mdico. Evite los ruidos fuertes. Use proteccin para los odos siempre que se exponga a ruidos fuertes o trabaje en un entorno ruidoso. Retome sus actividades normales segn lo por indicado el mdico. Pregntele al mdico qu actividades son seguras para usted. Concurra a [place]. Esto es importante. Comunquese con un mdico si: Siente mareos. Tiene nuevos sntomas. Vomita o siente nuseas. Tiene fiebre. Solicite ayuda de inmediato si: Percibe cambios repentinos en la visin. Tiene dolor intenso de odos. Aumenta su fatiga o debilidad. Tiene un dolor de cabeza intenso. Resumen La prdida [place] es una disminucin de la capacidad de or sonidos a su alrededor. Puede ser transitoria o permanente. El tratamiento depender de la causa de la prdida [place]. Puede incluir la extraccin de cerumen, medicamentos o un audfono. Es posible recuperar la audicin parcial o totalmente, en funcin de la causa de la prdida [place] y de su gravedad. Concurra a [place]. Esto es importante. Esta informacin no tiene Marine scientist el consejo del mdico. Asegrese de hacerle al mdico cualquier pregunta que tenga. Document Revised: 07/21/2021 Document Reviewed: 07/21/2021 Elsevier Patient Education  [place].

## 2022-05-07 NOTE — Assessment & Plan Note (Signed)
BP adequately controlled. Continue Amlodipine-Olmesartan 5-40 mg daily and low salt diet. Eye exam is current.

## 2022-05-07 NOTE — Assessment & Plan Note (Signed)
Stopped Allopurinol, not having signs of gout at this time, so continue low purine iet. If right MTP joint starts hurting again, we need to resume medication.

## 2022-05-08 MED ORDER — DULOXETINE HCL 30 MG PO CPEP: 30.0000 mg | ORAL_CAPSULE | Freq: Every day | ORAL | 1 refills | Status: DC

## 2022-05-08 MED ORDER — LEVOTHYROXINE SODIUM 150 MCG PO TABS: ORAL_TABLET | ORAL | 3 refills | Status: DC

## 2022-05-24 ENCOUNTER — Telehealth: Payer: Self-pay | Admitting: Family Medicine

## 2022-05-24 NOTE — Telephone Encounter (Signed)
Left message for patient to call back and schedule Medicare Annual Wellness Visit (AWV) either virtually or in office. Left  my jabber number [phone]   Last AWV  05/13/21 please schedule with Nurse Health Adviser   45 min for awv-i and in office appointments 30 min for awv-s  phone/virtual appointments  

## 2022-05-25 ENCOUNTER — Ambulatory Visit (INDEPENDENT_AMBULATORY_CARE_PROVIDER_SITE_OTHER): Payer: Medicare Other

## 2022-05-25 VITALS — BP 106/50 | HR 73 | Temp 97.7°F | Ht 65.5 in | Wt 171.4 lb

## 2022-05-25 DIAGNOSIS — Z0000 Encounter for general adult medical examination without abnormal findings: Secondary | ICD-10-CM

## 2022-05-25 NOTE — Progress Notes (Signed)
Subjective:   Clarence Sanders is a 76 y.o. male who presents for Medicare Annual/Subsequent preventive examination.  Review of Systems     Cardiac Risk Factors include: advanced age (>89mn, >>55women);hypertension     Objective:    Today's Vitals   05/25/22 1353  BP: (!) 106/50  Pulse: 73  Temp: 97.7 F (36.5 C)  TempSrc: Oral  SpO2: 93%  Weight: 171 lb 6.4 oz (77.7 kg)  Height: 5' 5.5" (1.664 m)   Body mass index is 28.09 kg/m.     05/25/2022    2:03 PM 11/15/2016    8:35 AM  Advanced Directives  Does Patient Have a Medical Advance Directive? No Yes  Type of Advance Directive  Living will  Would patient like information on creating a medical advance directive? No - Patient declined     Current Medications (verified) Outpatient Encounter Medications as of 05/25/2022  Medication Sig   amLODipine-olmesartan (AZOR) 5-40 MG tablet Take 1 tablet by mouth daily.   DULoxetine (CYMBALTA) 30 MG capsule Take 1 capsule (30 mg total) by mouth daily.   esomeprazole (NEXIUM) 40 MG capsule Take 40 mg by mouth daily at 12 noon.   levothyroxine (SYNTHROID) 150 MCG tablet 0.5 tab Tuesdays and Thursdays, 1 tab rest of days.   rosuvastatin (CRESTOR) 10 MG tablet Take 1 tablet (10 mg total) by mouth daily.   SYMBICORT 160-4.5 MCG/ACT inhaler INHALE 2 PUFFS INTO THE LUNGS BY MOUTH TWICE A DAY   doxepin (SINEQUAN) 10 MG/ML solution Take 0.3-1 mLs (3-10 mg total) by mouth at bedtime. (Patient not taking: Reported on 05/25/2022)   No facility-administered encounter medications on file as of 05/25/2022.    Allergies (verified) Aspirin, Penicillins, and Ketorolac tromethamine   History: Past Medical History:  Diagnosis Date   Allergy    Arthritis    Asthma    Hypertension    Hypogonadism male    Thyroid disease    Hypothyroidism   Varicose veins    Past Surgical History:  Procedure Laterality Date   VARICOSE VEIN SURGERY     removed vein in left leg   Family History   Problem Relation Age of Onset   Other Mother        varicose veins   Arthritis Mother    Asthma Mother    Heart disease Father        before age 75  Heart attack Father    Cancer Sister    Cancer Brother    Arthritis Paternal Grandmother    Social History   Socioeconomic History   Marital status: Married    Spouse name: Not on file   Number of children: 2   Years of education: Not on file   Highest education level: Not on file  Occupational History   Not on file  Tobacco Use   Smoking status: Never   Smokeless tobacco: Never  Vaping Use   Vaping Use: Never used  Substance and Sexual Activity   Alcohol use: Yes    Alcohol/week: 9.0 standard drinks of alcohol    Types: 5 Cans of beer, 4 Shots of liquor per week   Drug use: No   Sexual activity: Not Currently  Other Topics Concern   Not on file  Social History Narrative   Not on file   Social Determinants of Health   Financial Resource Strain: Low Risk  (05/25/2022)   Overall Financial Resource Strain (CARDIA)    Difficulty of Paying Living Expenses:  Not hard at all  Food Insecurity: No Food Insecurity (05/25/2022)   Hunger Vital Sign    Worried About Running Out of Food in the Last Year: Never true    Ran Out of Food in the Last Year: Never true  Transportation Needs: No Transportation Needs (05/25/2022)   PRAPARE - Hydrologist (Medical): No    Lack of Transportation (Non-Medical): No  Physical Activity: Sufficiently Active (05/25/2022)   Exercise Vital Sign    Days of Exercise per Week: 7 days    Minutes of Exercise per Session: 90 min  Stress: No Stress Concern Present (05/25/2022)   [place]    Feeling of Stress : Only a little  Social Connections: Not on file    Tobacco Counseling Counseling given: Not Answered   Clinical Intake:  Pre-visit preparation completed: Yes  Pain : No/denies pain      Nutritional Status: BMI 25 -29 Overweight Nutritional Risks: None Diabetes: No  How often do you need to have someone help you when you read instructions, pamphlets, or other written materials from your doctor or pharmacy?: 1 - Never What is the last grade level you completed in school?: 5th grade  Diabetic? no  Interpreter Needed?: Yes Interpreter Agency: [facility] Interpreter Name: Murlean Iba  Information entered by :: NAllen LPN   Activities of Daily Living    05/25/2022    2:04 PM  In your present state of health, do you have any difficulty performing the following activities:  Hearing? 1  Comment trouble on the right side  Vision? 0  Difficulty concentrating or making decisions? 0  Walking or climbing stairs? 0  Dressing or bathing? 0  Doing errands, shopping? 0  Preparing Food and eating ? N  Using the Toilet? N  In the past six months, have you accidently leaked urine? N  Do you have problems with loss of bowel control? N  Managing your Medications? N  Managing your Finances? N  Housekeeping or managing your Housekeeping? N    Patient Care Team: [place]ique, Betty G, MD as PCP - General (Family Medicine) Harvie Junior, MD as Referring Physician (Specialist)  Indicate any recent Medical Services you may have received from other than [facility] providers in the past year (date may be approximate).     Assessment:   This is a routine wellness examination for [place].  Hearing/Vision screen Vision Screening - Comments:: No regular eye exams,   Dietary issues and exercise activities discussed: Current Exercise Habits: Home exercise routine, Type of exercise: Other - see comments (running), Time (Minutes): > 60, Frequency (Times/Week): 5, Weekly Exercise (Minutes/Week): 0   Goals Addressed             This Visit's Progress    Patient Stated       05/25/2022, no goals       Depression Screen    05/25/2022    2:04 PM 05/07/2022    8:29 AM 01/05/2022     9:26 AM 05/13/2021   10:06 AM 02/13/2020    8:19 AM 02/06/2018    5:27 PM 06/22/2017    2:51 PM  PHQ 2/9 Scores  PHQ - 2 Score 0 0 0 0 0 0 0    Fall Risk    05/25/2022    2:04 PM 05/07/2022    8:29 AM 01/05/2022    9:26 AM 05/13/2021   10:06 AM 02/13/2020    8:06  AM  Fall Risk   Falls in the past year? 0 0 0 0 0  Number falls in past yr: 0 0 0 0   Injury with Fall? 0 0 0 0   Risk for fall due to : Medication side effect Other (Comment) No Fall Risks    Follow up Falls prevention discussed;Education provided;Falls evaluation completed Falls evaluation completed Falls evaluation completed Education provided     FALL RISK PREVENTION PERTAINING TO THE HOME:  Any stairs in or around the home? Yes  If so, are there any without handrails? No  Home free of loose throw rugs in walkways, pet beds, electrical cords, etc? Yes  Adequate lighting in your home to reduce risk of falls? Yes   ASSISTIVE DEVICES UTILIZED TO PREVENT FALLS:  Life alert? No  Use of a cane, walker or w/c? No  Grab bars in the bathroom? Yes  Shower chair or bench in shower? Yes  Elevated toilet seat or a handicapped toilet? Yes   TIMED UP AND GO:  Was the test performed? Yes .  Length of time to ambulate 10 feet: 5 sec.   Gait steady and fast without use of assistive device  Cognitive Function:        Immunizations Immunization History  Administered Date(s) Administered   Fluad Quad(high Dose 65+) 05/13/2021, 05/06/2022   Influenza Whole 06/10/2007   Influenza, High Dose Seasonal PF 06/27/2014, 06/17/2016, 04/07/2017, 06/29/2018, 06/10/2019, 06/17/2020   Influenza-Unspecified 05/28/2014, 04/15/2015   Janssen (J&J) SARS-COV-2 Vaccination 11/02/2019   PFIZER(Purple Top)SARS-COV-2 Vaccination 01/06/2021   Pfizer Covid-19 Vaccine Bivalent Booster 77yr & up 05/20/2021   Pneumococcal Conjugate-13 03/29/2018   Pneumococcal Polysaccharide-23 11/13/2012, 12/20/2012   Td 11/08/2002   Tdap 04/09/2014   Zoster  Recombinat (Shingrix) 04/03/2020, 01/15/2021    TDAP status: Up to date  Flu Vaccine status: Up to date  Pneumococcal vaccine status: Up to date  Covid-19 vaccine status: Completed vaccines  Qualifies for Shingles Vaccine? Yes   Zostavax completed No   Shingrix Completed?: Yes  Screening Tests Health Maintenance  Topic Date Due   COVID-19 Vaccine (4 - Additional dose for Janssen series) 09/20/2021   TETANUS/TDAP  04/09/2024   COLONOSCOPY (Pts 45-429yrInsurance coverage will need to be confirmed)  03/01/2032   Pneumonia Vaccine 6544Years old  Completed   INFLUENZA VACCINE  Completed   Hepatitis C Screening  Completed   Zoster Vaccines- Shingrix  Completed   HPV VACCINES  Aged Out    Health Maintenance  Health Maintenance Due  Topic Date Due   COVID-19 Vaccine (4 - Additional dose for Janssen series) 09/20/2021    Colorectal cancer screening: No longer required.   Lung Cancer Screening: (Low Dose CT Chest recommended if Age 75-80ears, 30 pack-year currently smoking OR have quit w/in 15years.) does not qualify.   Lung Cancer Screening Referral: no  Additional Screening:  Hepatitis C Screening: does qualify; Completed 11/15/2016  Vision Screening: Recommended annual ophthalmology exams for early detection of glaucoma and other disorders of the eye. Is the patient up to date with their annual eye exam?  No  Who is the provider or what is the name of the office in which the patient attends annual eye exams? none If pt is not established with a provider, would they like to be referred to a provider to establish care? No .   Dental Screening: Recommended annual dental exams for proper oral hygiene  Community Resource Referral / Chronic Care Management: CRR required this  visit?  No   CCM required this visit?  No      Plan:     I have personally reviewed and noted the following in the patient's chart:   Medical and social history Use of alcohol, tobacco or  illicit drugs  Current medications and supplements including opioid prescriptions. Patient is not currently taking opioid prescriptions. Functional ability and status Nutritional status Physical activity Advanced directives List of other physicians Hospitalizations, surgeries, and ER visits in previous 12 months Vitals Screenings to include cognitive, depression, and falls Referrals and appointments  In addition, I have reviewed and discussed with patient certain preventive protocols, quality metrics, and best practice recommendations. A written personalized care plan for preventive services as well as general preventive health recommendations were provided to patient.     Kellie Simmering, LPN   [id number]   Nurse Notes: 6 CIT not administered due to language barrier. Patient appears cognitive per direct observation.

## 2022-05-25 NOTE — Patient Instructions (Signed)
Clarence Sanders , Thank you for taking time to come for your Medicare Wellness Visit. I appreciate your ongoing commitment to your health goals. Please review the following plan we discussed and let me know if I can assist you in the future.   Screening recommendations/referrals: Colonoscopy: completed 03/01/2022 Recommended yearly ophthalmology/optometry visit for glaucoma screening and checkup Recommended yearly dental visit for hygiene and checkup  Vaccinations: Influenza vaccine: completed 05/06/2022 Pneumococcal vaccine: completed 03/29/2018 Tdap vaccine: completed 04/09/2014, due 04/09/2024 Shingles vaccine: discussed   Covid-19:  10/13/2019, 01/06/2021, 05/20/2021  Advanced directives: Advance directive discussed with you today. Even though you declined this today please call our office should you change your mind and we can give you the proper paperwork for you to fill out.  Conditions/risks identified: none  Next appointment: Follow up in one year for your annual wellness visit.   Preventive Care 75 Years and Older, Male Preventive care refers to lifestyle choices and visits with your health care provider that can promote health and wellness. What does preventive care include? A yearly physical exam. This is also called an annual well check. Dental exams once or twice a year. Routine eye exams. Ask your health care provider how often you should have your eyes checked. Personal lifestyle choices, including: Daily care of your teeth and gums. Regular physical activity. Eating a healthy diet. Avoiding tobacco and drug use. Limiting alcohol use. Practicing safe sex. Taking low doses of aspirin every day. Taking vitamin and mineral supplements as recommended by your health care provider. What happens during an annual well check? The services and screenings done by your health care provider during your annual well check will depend on your age, overall health, lifestyle risk factors, and  family history of disease. Counseling  Your health care provider may ask you questions about your: Alcohol use. Tobacco use. Drug use. Emotional well-being. Home and relationship well-being. Sexual activity. Eating habits. History of falls. Memory and ability to understand (cognition). Work and work Statistician. Screening  You may have the following tests or measurements: Height, weight, and BMI. Blood pressure. Lipid and cholesterol levels. These may be checked every 5 years, or more frequently if you are over 80 years old. Skin check. Lung cancer screening. You may have this screening every year starting at age 63 if you have a 30-pack-year history of smoking and currently smoke or have quit within the past 15 years. Fecal occult blood test (FOBT) of the stool. You may have this test every year starting at age 52. Flexible sigmoidoscopy or colonoscopy. You may have a sigmoidoscopy every 5 years or a colonoscopy every 10 years starting at age 82. Prostate cancer screening. Recommendations will vary depending on your family history and other risks. Hepatitis C blood test. Hepatitis B blood test. Sexually transmitted disease (STD) testing. Diabetes screening. This is done by checking your blood sugar (glucose) after you have not eaten for a while (fasting). You may have this done every 1-3 years. Abdominal aortic aneurysm (AAA) screening. You may need this if you are a current or former smoker. Osteoporosis. You may be screened starting at age 77 if you are at high risk. Talk with your health care provider about your test results, treatment options, and if necessary, the need for more tests. Vaccines  Your health care provider may recommend certain vaccines, such as: Influenza vaccine. This is recommended every year. Tetanus, diphtheria, and acellular pertussis (Tdap, Td) vaccine. You may need a Td booster every 10 years. Zoster vaccine.  You may need this after age 50. Pneumococcal  13-valent conjugate (PCV13) vaccine. One dose is recommended after age 55. Pneumococcal polysaccharide (PPSV23) vaccine. One dose is recommended after age 44. Talk to your health care provider about which screenings and vaccines you need and how often you need them. This information is not intended to replace advice given to you by your health care provider. Make sure you discuss any questions you have with your health care provider. Document Released: 08/22/2015 Document Revised: 04/14/2016 Document Reviewed: 05/27/2015 Elsevier Interactive Patient Education  2017 [employer] Prevention in the Home Falls can cause injuries. They can happen to people of all ages. There are many things you can do to make your home safe and to help prevent falls. What can I do on the outside of my home? Regularly fix the edges of walkways and driveways and fix any cracks. Remove anything that might make you trip as you walk through a door, such as a raised step or threshold. Trim any bushes or trees on the path to your home. Use bright outdoor lighting. Clear any walking paths of anything that might make someone trip, such as rocks or tools. Regularly check to see if handrails are loose or broken. Make sure that both sides of any steps have handrails. Any raised decks and porches should have guardrails on the edges. Have any leaves, snow, or ice cleared regularly. Use sand or salt on walking paths during winter. Clean up any spills in your garage right away. This includes oil or grease spills. What can I do in the bathroom? Use night lights. Install grab bars by the toilet and in the tub and shower. Do not use towel bars as grab bars. Use non-skid mats or decals in the tub or shower. If you need to sit down in the shower, use a plastic, non-slip stool. Keep the floor dry. Clean up any water that spills on the floor as soon as it happens. Remove soap buildup in the tub or shower regularly. Attach  bath mats securely with double-sided non-slip rug tape. Do not have throw rugs and other things on the floor that can make you trip. What can I do in the bedroom? Use night lights. Make sure that you have a light by your bed that is easy to reach. Do not use any sheets or blankets that are too big for your bed. They should not hang down onto the floor. Have a firm chair that has side arms. You can use this for support while you get dressed. Do not have throw rugs and other things on the floor that can make you trip. What can I do in the kitchen? Clean up any spills right away. Avoid walking on wet floors. Keep items that you use a lot in easy-to-reach places. If you need to reach something above you, use a strong step stool that has a grab bar. Keep electrical cords out of the way. Do not use floor polish or wax that makes floors slippery. If you must use wax, use non-skid floor wax. Do not have throw rugs and other things on the floor that can make you trip. What can I do with my stairs? Do not leave any items on the stairs. Make sure that there are handrails on both sides of the stairs and use them. Fix handrails that are broken or loose. Make sure that handrails are as long as the stairways. Check any carpeting to make sure that it is  firmly attached to the stairs. Fix any carpet that is loose or worn. Avoid having throw rugs at the top or bottom of the stairs. If you do have throw rugs, attach them to the floor with carpet tape. Make sure that you have a light switch at the top of the stairs and the bottom of the stairs. If you do not have them, ask someone to add them for you. What else can I do to help prevent falls? Wear shoes that: Do not have high heels. Have rubber bottoms. Are comfortable and fit you well. Are closed at the toe. Do not wear sandals. If you use a stepladder: Make sure that it is fully opened. Do not climb a closed stepladder. Make sure that both sides of the  stepladder are locked into place. Ask someone to hold it for you, if possible. Clearly mark and make sure that you can see: Any grab bars or handrails. First and last steps. Where the edge of each step is. Use tools that help you move around (mobility aids) if they are needed. These include: Canes. Walkers. Scooters. Crutches. Turn on the lights when you go into a dark area. Replace any light bulbs as soon as they burn out. Set up your furniture so you have a clear path. Avoid moving your furniture around. If any of your floors are uneven, fix them. If there are any pets around you, be aware of where they are. Review your medicines with your doctor. Some medicines can make you feel dizzy. This can increase your chance of falling. Ask your doctor what other things that you can do to help prevent falls. This information is not intended to replace advice given to you by your health care provider. Make sure you discuss any questions you have with your health care provider. Document Released: 05/22/2009 Document Revised: 01/01/2016 Document Reviewed: 08/30/2014 Elsevier Interactive Patient Education  2017 [employer].

## 2022-06-30 DIAGNOSIS — G4733 Obstructive sleep apnea (adult) (pediatric): Secondary | ICD-10-CM | POA: Diagnosis not present

## 2022-07-06 DIAGNOSIS — H25813 Combined forms of age-related cataract, bilateral: Secondary | ICD-10-CM | POA: Diagnosis not present

## 2022-07-06 DIAGNOSIS — H43813 Vitreous degeneration, bilateral: Secondary | ICD-10-CM | POA: Diagnosis not present

## 2022-07-06 NOTE — Progress Notes (Unsigned)
ACUTE VISIT No chief complaint on file.  HPI: [name] is a 76 y.o. male, who is here today complaining of *** HPI  Review of Systems See other pertinent positives and negatives in HPI.  Current Outpatient Medications on File Prior to Visit  Medication Sig Dispense Refill   amLODipine-olmesartan (AZOR) 5-40 MG tablet Take 1 tablet by mouth daily. 90 tablet 3   doxepin (SINEQUAN) 10 MG/ML solution Take 0.3-1 mLs (3-10 mg total) by mouth at bedtime. (Patient not taking: Reported on 05/25/2022) 30 mL 1   DULoxetine (CYMBALTA) 30 MG capsule Take 1 capsule (30 mg total) by mouth daily. 90 capsule 1   esomeprazole (NEXIUM) 40 MG capsule Take 40 mg by mouth daily at 12 noon.     levothyroxine (SYNTHROID) 150 MCG tablet 0.5 tab Tuesdays and Thursdays, 1 tab rest of days. 90 tablet 3   rosuvastatin (CRESTOR) 10 MG tablet Take 1 tablet (10 mg total) by mouth daily. 90 tablet 3   SYMBICORT 160-4.5 MCG/ACT inhaler INHALE 2 PUFFS INTO THE LUNGS BY MOUTH TWICE A DAY 10.2 g 0   No current facility-administered medications on file prior to visit.    Past Medical History:  Diagnosis Date   Allergy    Arthritis    Asthma    Hypertension    Hypogonadism male    Thyroid disease    Hypothyroidism   Varicose veins    Allergies  Allergen Reactions   Aspirin Anaphylaxis    Angioedema,    Penicillins Anaphylaxis   Ketorolac Tromethamine     Social History   Socioeconomic History   Marital status: Married    Spouse name: Not on file   Number of children: 2   Years of education: Not on file   Highest education level: Not on file  Occupational History   Not on file  Tobacco Use   Smoking status: Never   Smokeless tobacco: Never  Vaping Use   Vaping Use: Never used  Substance and Sexual Activity   Alcohol use: Yes    Alcohol/week: 9.0 standard drinks of alcohol    Types: 5 Cans of beer, 4 Shots of liquor per week   Drug use: No   Sexual activity: Not Currently   Other Topics Concern   Not on file  Social History Narrative   Not on file   Social Determinants of Health   Financial Resource Strain: Low Risk  (05/25/2022)   Overall Financial Resource Strain (CARDIA)    Difficulty of Paying Living Expenses: Not hard at all  Food Insecurity: No Food Insecurity (05/25/2022)   Hunger Vital Sign    Worried About Running Out of Food in the Last Year: Never true    Ran Out of Food in the Last Year: Never true  Transportation Needs: No Transportation Needs (05/25/2022)   PRAPARE - Hydrologist (Medical): No    Lack of Transportation (Non-Medical): No  Physical Activity: Sufficiently Active (05/25/2022)   Exercise Vital Sign    Days of Exercise per Week: 7 days    Minutes of Exercise per Session: 90 min  Stress: No Stress Concern Present (05/25/2022)   [place]    Feeling of Stress : Only a little  Social Connections: Not on file    There were no vitals filed for this visit. There is no height or weight on file to calculate BMI.  Physical Exam  ASSESSMENT AND  PLAN: There are no diagnoses linked to this encounter.  No follow-ups on file.  Haze Antillon G. [place], MD  [facility]. [place] office.  Discharge Instructions   None

## 2022-07-07 ENCOUNTER — Encounter: Payer: Self-pay | Admitting: Family Medicine

## 2022-07-07 ENCOUNTER — Ambulatory Visit (INDEPENDENT_AMBULATORY_CARE_PROVIDER_SITE_OTHER): Payer: Medicare Other | Admitting: Family Medicine

## 2022-07-07 VITALS — BP 120/70 | HR 73 | Temp 98.0°F | Resp 16 | Ht 65.5 in | Wt 175.0 lb

## 2022-07-07 DIAGNOSIS — G8929 Other chronic pain: Secondary | ICD-10-CM | POA: Diagnosis not present

## 2022-07-07 DIAGNOSIS — R972 Elevated prostate specific antigen [PSA]: Secondary | ICD-10-CM

## 2022-07-07 DIAGNOSIS — E039 Hypothyroidism, unspecified: Secondary | ICD-10-CM | POA: Diagnosis not present

## 2022-07-07 DIAGNOSIS — R198 Other specified symptoms and signs involving the digestive system and abdomen: Secondary | ICD-10-CM

## 2022-07-07 DIAGNOSIS — R351 Nocturia: Secondary | ICD-10-CM

## 2022-07-07 DIAGNOSIS — M79605 Pain in left leg: Secondary | ICD-10-CM

## 2022-07-07 DIAGNOSIS — E875 Hyperkalemia: Secondary | ICD-10-CM | POA: Diagnosis not present

## 2022-07-07 DIAGNOSIS — N401 Enlarged prostate with lower urinary tract symptoms: Secondary | ICD-10-CM

## 2022-07-07 DIAGNOSIS — R1031 Right lower quadrant pain: Secondary | ICD-10-CM

## 2022-07-07 DIAGNOSIS — M79604 Pain in right leg: Secondary | ICD-10-CM

## 2022-07-07 DIAGNOSIS — N138 Other obstructive and reflux uropathy: Secondary | ICD-10-CM | POA: Insufficient documentation

## 2022-07-07 LAB — POTASSIUM: Potassium: 4.7 mEq/L (ref 3.5–5.1)

## 2022-07-07 MED ORDER — TAMSULOSIN HCL 0.4 MG PO CAPS: 0.4000 mg | ORAL_CAPSULE | Freq: Every day | ORAL | 2 refills | Status: DC

## 2022-07-07 NOTE — Assessment & Plan Note (Signed)
On examination today I did not appreciate any hernia. ?  Tendinitis. Referral to sport medicine placed.

## 2022-07-07 NOTE — Assessment & Plan Note (Signed)
Last TSH in 04/2022 was 0.02, he did not change dose of levothyroxine as recommended. Continue levothyroxine 150 mcg daily x5 days and 1/2 tablet Tuesdays and Thursdays. We will plan on checking TSH in 08/2022, after coming back from vacation in [place].

## 2022-07-07 NOTE — Assessment & Plan Note (Signed)
Mild. We will repeat potassium today and further recommendation will be given accordingly. Continue olmesartan 40 mg daily.

## 2022-07-07 NOTE — Assessment & Plan Note (Signed)
Rectal discomfort he is reporting today could be caused by constipation.Prostate disease could be a contributing factor. Digital rectal exam and anal inspection negative. Colonoscopy in 02/2022, 5 polyps were removed, 3-year follow-up was recommended. Recommend trying MiraLAX daily at bedtime for a few weeks and monitor for changes.  If problem is persistent, we can consider GI evaluation.

## 2022-07-07 NOTE — Assessment & Plan Note (Signed)
This problem seems to be chronic. We discussed possible etiologies, some of his comorbidities could be contributing factors.?  Vein disease, radicular pain, and OA, son to consider. History and examination today do not suggest a serious process. Recommend taking Tylenol 500 mg 3-4 times per day and decrease exercise intensity. He has seen vascular in the past, he is not interested in referral at this time.

## 2022-07-07 NOTE — Assessment & Plan Note (Signed)
Last visit PSA was mildly abnormal, urology evaluation recommended but we were not able to contact patient, results were mailed, he is not sure if he received a letter. We discussed possible etiologies. He would like to try medication to help with nocturia while he is waiting to see a urologist, Flomax 0.4 mg daily recommended.

## 2022-07-07 NOTE — Patient Instructions (Addendum)
A few things to remember from today's visit:  Hyperkalemia  Hypothyroidism (acquired)  Abnormal PSA - Plan: Ambulatory referral to Urology  BPH associated with nocturia - Plan: tamsulosin (FLOMAX) 0.4 MG CAPS capsule  Pain in both lower extremities  Groin pain, chronic, right - Plan: Ambulatory referral to [place] miralax diario en la noche a ver si ayuda con la molestia en el ano. Flomax 1 tab diaria en la noche. [place] tiroides a media tab [employer] y [place], el resto [place] tab entera y repetimos el examen en enero.  Tylenol para el dolor. Varices puede ser la cause del dolor en las piernas. Please be sure medication list is accurate. If a new problem present, please set up appointment sooner than planned today.

## 2022-07-08 ENCOUNTER — Ambulatory Visit (INDEPENDENT_AMBULATORY_CARE_PROVIDER_SITE_OTHER): Payer: Medicare Other

## 2022-07-08 ENCOUNTER — Ambulatory Visit: Payer: Medicare Other | Admitting: Sports Medicine

## 2022-07-08 VITALS — BP 132/82 | HR 66 | Ht 65.0 in | Wt 175.0 lb

## 2022-07-08 DIAGNOSIS — M545 Low back pain, unspecified: Secondary | ICD-10-CM | POA: Diagnosis not present

## 2022-07-08 DIAGNOSIS — M1611 Unilateral primary osteoarthritis, right hip: Secondary | ICD-10-CM | POA: Diagnosis not present

## 2022-07-08 DIAGNOSIS — G8929 Other chronic pain: Secondary | ICD-10-CM

## 2022-07-08 DIAGNOSIS — M25551 Pain in right hip: Secondary | ICD-10-CM | POA: Diagnosis not present

## 2022-07-08 DIAGNOSIS — M4316 Spondylolisthesis, lumbar region: Secondary | ICD-10-CM | POA: Diagnosis not present

## 2022-07-08 MED ORDER — METHYLPREDNISOLONE 4 MG PO TBPK: ORAL_TABLET | ORAL | 0 refills | Status: DC

## 2022-07-08 NOTE — Patient Instructions (Addendum)
God to see you  Hip HEP  Prednisone dos pak  2-3 week follow up

## 2022-07-08 NOTE — Progress Notes (Signed)
Clarence Sanders D.[facility] [place] Phone: [phone]   Assessment and Plan:     1. Chronic right hip pain 2. Primary osteoarthritis of right hip -Chronic with exacerbation, initial sports medicine visit - Consistent with osteoarthritis of right hip based on HPI, physical exam, x-ray - Start prednisone Dosepak - Patient states that he has an anaphylactic reaction to NSAIDs, so we will not prescribe NSAIDs today - May use Tylenol as needed for breakthrough pain - Start HEP for hip - X-ray obtained in clinic.  My interpretation: No acute fracture or dislocation.  Moderate cortical changes in bilateral femoral acetabular joints, more progressive in right compared to left.  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  Take 5 tablets on day 2.  Take 4 tablets on day 3.  Take 3 tablets on day 4.  Take 2 tablets on day 5.  Take 1 tablet on day 6.    Pertinent previous records reviewed include none   Follow Up: 3 weeks for reevaluation.  If no improvement or worsening of symptoms, would recommend an intra-articular right hip CSI   Subjective:   I, Clarence Sanders, am serving as a Education administrator for Clarence Sanders  Chief Complaint: low back and right hip pain   HPI:   07/08/22 Patient is a 76 year old male complaining of low back and right hip pain . Patient states that  the pain is in the groin not when he walks but after he finishes walking that's when it hurts, he has to rest for 2-3 days and its just a cycle , sometimes he takes meds for the pain , meds help when he does take them , no numbness or tingling, pain does go down the leg but the pain is deep inside the leg ,   Relevant Historical Information: Hypertension, GERD, Barrett's  esophagus  Additional pertinent review of systems negative.   Current Outpatient Medications:    amLODipine-olmesartan (AZOR) 5-40 MG tablet, Take 1 tablet by mouth daily., Disp: 90 tablet, Rfl: 3   doxepin (SINEQUAN) 10 MG/ML solution, Take 0.3-1 mLs (3-10 mg total) by mouth at bedtime., Disp: 30 mL, Rfl: 1   DULoxetine (CYMBALTA) 30 MG capsule, Take 1 capsule (30 mg total) by mouth daily., Disp: 90 capsule, Rfl: 1   esomeprazole (NEXIUM) 40 MG capsule, Take 40 mg by mouth daily at 12 noon., Disp: , Rfl:    levothyroxine (SYNTHROID) 150 MCG tablet, 0.5 tab Tuesdays and Thursdays, 1 tab rest of days., Disp: 90 tablet, Rfl: 3   methylPREDNISolone (MEDROL DOSEPAK) 4 MG TBPK tablet, Take 6 tablets on day 1.  Take 5 tablets on day 2.  Take 4 tablets on day 3.  Take 3 tablets on day 4.  Take 2 tablets on day 5.  Take 1 tablet on day 6., Disp: 21 tablet, Rfl: 0   rosuvastatin (CRESTOR) 10 MG tablet, Take 1 tablet (10 mg total) by mouth daily., Disp: 90 tablet, Rfl: 3   SYMBICORT 160-4.5 MCG/ACT inhaler, INHALE 2 PUFFS INTO THE LUNGS BY MOUTH TWICE A DAY, Disp: 10.2 g, Rfl: 0   tamsulosin (FLOMAX) 0.4 MG CAPS capsule, Take 1 capsule (0.4 mg total) by mouth daily., Disp: 30 capsule, Rfl: 2   Objective:  Vitals:   07/08/22 1315  BP: 132/82  Pulse: 66  SpO2: 96%  Weight: 175 lb (79.4 kg)  Height: '5\' 5"'$  (1.651 m)      Body mass index is 29.12 kg/m.    Physical Exam:    General: awake, alert, and oriented no acute distress, nontoxic Skin: no suspicious lesions or rashes Neuro:sensation intact distally with no dificits, normal muscle tone, no atrophy, strength 5/5 in all tested lower ext groups Psych: normal mood and affect, speech clear  Right hip: No deformity, swelling or wasting ROM Flexion 80, ext 20, IR 30, ER 35 NTTP over the hip flexors, greater trochanter, gluteal musculature, si joint, lumbar spine Negative log roll with FROM   Positive FADIR   Gait normal     Electronically signed by:  Clarence Sanders D.[facility] 1:38 PM 07/08/22

## 2022-07-13 ENCOUNTER — Telehealth: Payer: Self-pay | Admitting: Family Medicine

## 2022-07-13 MED ORDER — LEVOTHYROXINE SODIUM 150 MCG PO TABS: ORAL_TABLET | ORAL | 3 refills | Status: DC

## 2022-07-13 NOTE — Telephone Encounter (Signed)
Patient needs a refill on Levothyroxine.  Pharmacy- [facility]

## 2022-08-11 NOTE — Progress Notes (Deleted)
    Benito Mccreedy D.[place] Phone: [phone]   Assessment and Plan:     There are no diagnoses linked to this encounter.  ***   Pertinent previous records reviewed include ***   Follow Up: ***     Subjective:   I, Noam Franzen, am serving as a Education administrator for Doctor Glennon Mac   Chief Complaint: low back and right hip pain    HPI:    07/08/22 Patient is a 77 year old male complaining of low back and right hip pain . Patient states that  the pain is in the groin not when he walks but after he finishes walking that's when it hurts, he has to rest for 2-3 days and its just a cycle , sometimes he takes meds for the pain , meds help when he does take them , no numbness or tingling, pain does go down the leg but the pain is deep inside the leg ,    08/12/2022 Patient states   Relevant Historical Information: Hypertension, GERD, Barrett's esophagus    Additional pertinent review of systems negative.   Current Outpatient Medications:    amLODipine-olmesartan (AZOR) 5-40 MG tablet, Take 1 tablet by mouth daily., Disp: 90 tablet, Rfl: 3   doxepin (SINEQUAN) 10 MG/ML solution, Take 0.3-1 mLs (3-10 mg total) by mouth at bedtime., Disp: 30 mL, Rfl: 1   DULoxetine (CYMBALTA) 30 MG capsule, Take 1 capsule (30 mg total) by mouth daily., Disp: 90 capsule, Rfl: 1   esomeprazole (NEXIUM) 40 MG capsule, Take 40 mg by mouth daily at 12 noon., Disp: , Rfl:    levothyroxine (SYNTHROID) 150 MCG tablet, 0.5 tab Tuesdays and Thursdays, 1 tab rest of days., Disp: 90 tablet, Rfl: 3   methylPREDNISolone (MEDROL DOSEPAK) 4 MG TBPK tablet, Take 6 tablets on day 1.  Take 5 tablets on day 2.  Take 4 tablets on day 3.  Take 3 tablets on day 4.  Take 2 tablets on day 5.  Take 1 tablet on day 6., Disp: 21 tablet, Rfl: 0   rosuvastatin (CRESTOR) 10 MG tablet, Take 1 tablet (10 mg total) by mouth daily., Disp: 90 tablet, Rfl: 3    SYMBICORT 160-4.5 MCG/ACT inhaler, INHALE 2 PUFFS INTO THE LUNGS BY MOUTH TWICE A DAY, Disp: 10.2 g, Rfl: 0   tamsulosin (FLOMAX) 0.4 MG CAPS capsule, Take 1 capsule (0.4 mg total) by mouth daily., Disp: 30 capsule, Rfl: 2   Objective:     There were no vitals filed for this visit.    There is no height or weight on file to calculate BMI.    Physical Exam:    ***   Electronically signed by:  Benito Mccreedy D.[facility] 12:19 PM 08/11/22

## 2022-08-12 ENCOUNTER — Ambulatory Visit: Payer: Medicare Other | Admitting: Sports Medicine

## 2022-08-20 NOTE — Progress Notes (Unsigned)
HPI: Clarence Sanders is a 77 y.o. male, who is here today for chronic disease management.  Last seen on 07/07/22, when he was c/o persistent LE pain, specially joints and exacerbated after exercise. Joint pain is also exacerbated by cold weather. Negative for joint edema or erythema.  -Abnormal PSA and urinary frequency: He states that he has not been contacted by the urologist but according to records multiple voicemails being left for him, so referral was closed.  He denies new associated symptoms. Currently he is on Flomax 0.4 mg daily. Lab Results  Component Value Date   PSA 4.22 (H) 05/07/2022   PSA 3.30 05/13/2021   PSA 2.32 11/14/2007   -He also complains of a "buzzing" in his ears, very annoyed, as well as difficulty hearing.  Problem has been going on for years, hearing loss has been gradual through the years. Ringing in ears has been intermittently for years. Negative for dizziness,earache, or drainage.  -HTN:  Currently he is on amlodipine-olmesartan 5-40 mg daily. He had an eye exam two weeks ago and was told that everything was fine.  He reports that he has been monitoring his blood pressure at home and it has been well-controlled, 120's/70's. Negative for unusual or severe headache, visual changes, exertional chest pain, dyspnea,claudication, focal weakness, or edema.  Lab Results  Component Value Date   CREATININE 0.84 05/07/2022   BUN 17 05/07/2022   NA 139 05/07/2022   K 4.7 07/07/2022   CL 102 05/07/2022   CO2 28 05/07/2022   Prediabetes: Negative for polyphagia, polydipsia, polyuria. Lab Results  Component Value Date   HGBA1C 5.9 01/05/2022   Hypothyroidism: Dose was adjusted after last TSH, currently he is on levothyroxine 150 mcg 1/2 tablet Tuesdays and Thursdays, 1 tablet the rest of the days. He has not noted tremor, changes in bowel habits, or abnormal weight loss. Lab Results  Component Value Date   TSH 0.02 (L) 05/07/2022   Asthma:  Currently he is on Symbicort 160-4.5 mcg twice daily and albuterol inhaler. He denies having any cough, wheezing, or dyspnea but has noticed that his chest felt better with Advair that he feels now with Symbicort. He would like to change back to Advair.  Review of Systems  Constitutional:  Negative for activity change, appetite change and fever.  HENT:  Negative for mouth sores, nosebleeds and sore throat.   Gastrointestinal:  Negative for abdominal pain, nausea and vomiting.  Endocrine: Negative for cold intolerance and heat intolerance.  Genitourinary:  Negative for decreased urine volume and hematuria.  Musculoskeletal:  Positive for arthralgias. Negative for gait problem.  Allergic/Immunologic: Positive for environmental allergies.  Neurological:  Negative for syncope and facial asymmetry.  Psychiatric/Behavioral:  Negative for confusion.   See other pertinent positives and negatives in HPI.  Current Outpatient Medications on File Prior to Visit  Medication Sig Dispense Refill   amLODipine-olmesartan (AZOR) 5-40 MG tablet Take 1 tablet by mouth daily. 90 tablet 3   doxepin (SINEQUAN) 10 MG/ML solution Take 0.3-1 mLs (3-10 mg total) by mouth at bedtime. 30 mL 1   DULoxetine (CYMBALTA) 30 MG capsule Take 1 capsule (30 mg total) by mouth daily. 90 capsule 1   esomeprazole (NEXIUM) 40 MG capsule Take 40 mg by mouth daily at 12 noon.     levothyroxine (SYNTHROID) 150 MCG tablet 0.5 tab Tuesdays and Thursdays, 1 tab rest of days. 90 tablet 3   rosuvastatin (CRESTOR) 10 MG tablet Take 1 tablet (10 mg total) by mouth  daily. 90 tablet 3   tamsulosin (FLOMAX) 0.4 MG CAPS capsule Take 1 capsule (0.4 mg total) by mouth daily. 30 capsule 2   No current facility-administered medications on file prior to visit.   Past Medical History:  Diagnosis Date   Allergy    Arthritis    Asthma    Hypertension    Hypogonadism male    Thyroid disease    Hypothyroidism   Varicose veins    Allergies   Allergen Reactions   Aspirin Anaphylaxis    Angioedema,    Penicillins Anaphylaxis   Ketorolac Tromethamine    Social History   Socioeconomic History   Marital status: Married    Spouse name: Not on file   Number of children: 2   Years of education: Not on file   Highest education level: Not on file  Occupational History   Not on file  Tobacco Use   Smoking status: Never   Smokeless tobacco: Never  Vaping Use   Vaping Use: Never used  Substance and Sexual Activity   Alcohol use: Yes    Alcohol/week: 9.0 standard drinks of alcohol    Types: 5 Cans of beer, 4 Shots of liquor per week   Drug use: No   Sexual activity: Not Currently  Other Topics Concern   Not on file  Social History Narrative   Not on file   Social Determinants of Health   Financial Resource Strain: Low Risk  (05/25/2022)   Overall Financial Resource Strain (CARDIA)    Difficulty of Paying Living Expenses: Not hard at all  Food Insecurity: No Food Insecurity (05/25/2022)   Hunger Vital Sign    Worried About Running Out of Food in the Last Year: Never true    Ran Out of Food in the Last Year: Never true  Transportation Needs: No Transportation Needs (05/25/2022)   PRAPARE - Hydrologist (Medical): No    Lack of Transportation (Non-Medical): No  Physical Activity: Sufficiently Active (05/25/2022)   Exercise Vital Sign    Days of Exercise per Week: 7 days    Minutes of Exercise per Session: 90 min  Stress: No Stress Concern Present (05/25/2022)   [place]    Feeling of Stress : Only a little  Social Connections: Not on file   Vitals:   08/23/22 0811  BP: 118/70  Pulse: 70  Resp: 16  Temp: 98.2 F (36.8 C)  SpO2: 94%   Body mass index is 29.52 kg/m.  Physical Exam Vitals and nursing note reviewed.  Constitutional:      General: He is not in acute distress.    Appearance: He is  well-developed.  HENT:     Head: Normocephalic and atraumatic.     Ears:     Comments: Small amount of cerumen in right ear, able to see TM. Eyes:     Conjunctiva/sclera: Conjunctivae normal.  Cardiovascular:     Rate and Rhythm: Normal rate and regular rhythm.     Pulses:          Dorsalis pedis pulses are 2+ on the right side and 2+ on the left side.     Heart sounds: No murmur heard. Pulmonary:     Effort: Pulmonary effort is normal. No respiratory distress.     Breath sounds: No rhonchi or rales.  Abdominal:     Palpations: Abdomen is soft. There is no hepatomegaly or mass.  Tenderness: There is no abdominal tenderness.     Hernia: There is no hernia in the left inguinal area or right inguinal area.  Genitourinary:    Testes:        Right: Mass or tenderness not present.        Left: Mass or tenderness not present.     Epididymis:     Right: Not enlarged.     Left: Not enlarged.     Prostate: Enlarged. Not tender.     Rectum: No mass, tenderness or external hemorrhoid. Normal anal tone.     Comments: Declined chaperone. Musculoskeletal:     Right hip: No tenderness or bony tenderness. Decreased range of motion (internal and external rotation, mild).       Legs:  Lymphadenopathy:     Lower Body: No right inguinal adenopathy. No left inguinal adenopathy.  Skin:    General: Skin is warm.     Findings: No erythema or rash.  Neurological:     General: No focal deficit present.     Mental Status: He is alert and oriented to person, place, and time.     Gait: Gait normal.  Psychiatric:        Mood and Affect: Affect normal. Mood is anxious.   ASSESSMENT AND PLAN:  Clarence Sanders was seen today for follow-up.  Diagnoses and all orders for this visit: Orders Placed This Encounter  Procedures   Basic metabolic panel   Hemoglobin A1c   T4, free   TSH   Ambulatory referral to Audiology   Lab Results  Component Value Date   TSH 5.32 08/23/2022   Lab Results   Component Value Date   CREATININE 0.88 08/23/2022   BUN 20 08/23/2022   NA 138 08/23/2022   K 4.1 08/23/2022   CL 100 08/23/2022   CO2 27 08/23/2022   Lab Results  Component Value Date   HGBA1C 6.0 08/23/2022  Essential (primary) hypertension Assessment & Plan: BP adequately controlled. Continue Amlodipine-Olmesartan 5-40 mg daily. Continue low salt diet. Eye exam is current.  Orders: -     Basic metabolic panel; Future  Generalized osteoarthritis of multiple sites Assessment & Plan: Problem is otherwise stable. He feels like duloxetine is helping some, so continue 30 mg daily.   Prediabetes Assessment & Plan: Last hemoglobin A1c 5.9 in 12/2021. Continue a healthy lifestyle for diabetes prevention. Further recommendation will be given according to hemoglobin A1c result.  Orders: -     Hemoglobin A1c; Future  Tinnitus of both ears Assessment & Plan: Problem is chronic. We discussed diagnosis and prognosis. Explained that we do not have effective treatment, white noise at night may help. Hearing aids may help. Continue monitoring for new symptoms.   Sensorineural hearing loss (SNHL) of both ears Assessment & Plan: Hearing screening today mildly abnormal on left side. This has been gradual for years. Referral to audiologist placed. Hearing Screening   '500Hz'$  '1000Hz'$  '2000Hz'$  '4000Hz'$   Right ear Pass Pass Pass Pass  Left ear Pass Pass Pass Fail    Orders: -     Ambulatory referral to Audiology  Mild intermittent asthma without complication Assessment & Plan: Problem is still well-controlled but he feels like he felt better after using Advair, so he would like to change Symbicort back to Advair. Advair 250-50 mcg 1 puff twice daily recommended. Son side effect discussed, did remind him to swish after use. Continue albuterol inhaler 2 puff every 6 hours as needed.  Orders: -  Fluticasone-Salmeterol; Inhale 1 puff into the lungs in the morning and at bedtime.   Dispense: 60 each; Refill: 6  Hypothyroidism (acquired) Assessment & Plan: Last TSH 0.02 in 04/2022. Continue levothyroxine 150 mcg 1/2 tablet Tuesdays and Thursdays and 1 tablet the rest of the days. Further recommendation will be given according to TSH result.  Orders: -     T4, free; Future -     TSH; Future   I spent a total of 40 minutes in both face to face and non face to face activities for this visit on the date of this encounter. During this time history was obtained and documented, examination was performed, prior labs reviewed, and assessment/plan discussed. In regard to urology referral, apparently he missed phone calls x 3. Contact information was given, so he can call provider's office to arrange appointment. Instructed to be sure that his contact information is current.  Return in about 6 months (around 02/21/2023) for chronic problems.  Charlisa Cham G. [place], MD  [facility]. [place] office.

## 2022-08-23 ENCOUNTER — Encounter: Payer: Self-pay | Admitting: Family Medicine

## 2022-08-23 ENCOUNTER — Ambulatory Visit (INDEPENDENT_AMBULATORY_CARE_PROVIDER_SITE_OTHER): Payer: Medicare Other | Admitting: Family Medicine

## 2022-08-23 VITALS — BP 118/70 | HR 70 | Temp 98.2°F | Resp 16 | Ht 65.0 in | Wt 177.4 lb

## 2022-08-23 DIAGNOSIS — E039 Hypothyroidism, unspecified: Secondary | ICD-10-CM | POA: Diagnosis not present

## 2022-08-23 DIAGNOSIS — J4520 Mild intermittent asthma, uncomplicated: Secondary | ICD-10-CM

## 2022-08-23 DIAGNOSIS — I10 Essential (primary) hypertension: Secondary | ICD-10-CM | POA: Diagnosis not present

## 2022-08-23 DIAGNOSIS — R7303 Prediabetes: Secondary | ICD-10-CM

## 2022-08-23 DIAGNOSIS — H903 Sensorineural hearing loss, bilateral: Secondary | ICD-10-CM

## 2022-08-23 DIAGNOSIS — H9313 Tinnitus, bilateral: Secondary | ICD-10-CM | POA: Diagnosis not present

## 2022-08-23 DIAGNOSIS — M159 Polyosteoarthritis, unspecified: Secondary | ICD-10-CM

## 2022-08-23 LAB — BASIC METABOLIC PANEL
BUN: 20 mg/dL (ref 6–23)
CO2: 27 mEq/L (ref 19–32)
Calcium: 9.5 mg/dL (ref 8.4–10.5)
Chloride: 100 mEq/L (ref 96–112)
Creatinine, Ser: 0.88 mg/dL (ref 0.40–1.50)
GFR: 83.83 mL/min (ref >60.00)
Glucose, Bld: 98 mg/dL (ref 70–99)
Potassium: 4.1 mEq/L (ref 3.5–5.1)
Sodium: 138 mEq/L (ref 135–145)

## 2022-08-23 LAB — TSH: TSH: 5.32 u[IU]/mL (ref 0.35–5.50)

## 2022-08-23 LAB — T4, FREE: Free T4: 0.98 ng/dL (ref 0.60–1.60)

## 2022-08-23 LAB — HEMOGLOBIN A1C: Hgb A1c MFr Bld: 6 % (ref 4.6–6.5)

## 2022-08-23 MED ORDER — FLUTICASONE-SALMETEROL 250-50 MCG/ACT IN AEPB: 1.0000 | INHALATION_SPRAY | Freq: Two times a day (BID) | RESPIRATORY_TRACT | 6 refills | Status: DC

## 2022-08-23 NOTE — Assessment & Plan Note (Signed)
BP adequately controlled. Continue Amlodipine-Olmesartan 5-40 mg daily. Continue low salt diet. Eye exam is current.

## 2022-08-23 NOTE — Assessment & Plan Note (Addendum)
Problem is still well-controlled but he feels like he felt better after using Advair, so he would like to change Symbicort back to Advair. Advair 250-50 mcg 1 puff twice daily recommended. Son side effect discussed, did remind him to swish after use. Continue albuterol inhaler 2 puff every 6 hours as needed.

## 2022-08-23 NOTE — Assessment & Plan Note (Signed)
Last hemoglobin A1c 5.9 in 12/2021. Continue a healthy lifestyle for diabetes prevention. Further recommendation will be given according to hemoglobin A1c result.

## 2022-08-23 NOTE — Patient Instructions (Addendum)
A few things to remember from today's visit:  Essential (primary) hypertension - Plan: Basic metabolic panel  Generalized osteoarthritis of multiple sites  Prediabetes - Plan: Hemoglobin A1c  Tinnitus of both ears  Sensorineural hearing loss (SNHL) of both ears - Plan: Ambulatory referral to Audiology  Mild intermittent asthma without complication  Hypothyroidism (acquired) - Plan: T4, free, TSH  If you need refills for medications you take chronically, please call your pharmacy. Do not use My Chart to request refills or for acute issues that need immediate attention. If you send a my chart message, it may take a few days to be addressed, specially if I am not in the office.  Please be sure medication list is accurate. If a new problem present, please set up appointment sooner than planned today.

## 2022-08-23 NOTE — Assessment & Plan Note (Signed)
Problem is otherwise stable. He feels like duloxetine is helping some, so continue 30 mg daily.

## 2022-08-23 NOTE — Assessment & Plan Note (Signed)
Last TSH 0.02 in 04/2022. Continue levothyroxine 150 mcg 1/2 tablet Tuesdays and Thursdays and 1 tablet the rest of the days. Further recommendation will be given according to TSH result.

## 2022-08-23 NOTE — Assessment & Plan Note (Signed)
Problem is chronic. We discussed diagnosis and prognosis. Explained that we do not have effective treatment, white noise at night may help. Hearing aids may help. Continue monitoring for new symptoms.

## 2022-08-23 NOTE — Assessment & Plan Note (Signed)
Hearing screening today mildly abnormal on left side. This has been gradual for years. Referral to audiologist placed. Hearing Screening   '500Hz'$  '1000Hz'$  '2000Hz'$  '4000Hz'$   Right ear Pass Pass Pass Pass  Left ear Pass Pass Pass Fail

## 2022-08-30 ENCOUNTER — Encounter: Payer: Self-pay | Admitting: Urology

## 2022-08-30 ENCOUNTER — Ambulatory Visit: Payer: Medicare Other | Admitting: Urology

## 2022-08-30 VITALS — BP 134/81 | HR 66 | Ht 62.0 in | Wt 176.0 lb

## 2022-08-30 DIAGNOSIS — R972 Elevated prostate specific antigen [PSA]: Secondary | ICD-10-CM

## 2022-08-30 DIAGNOSIS — N138 Other obstructive and reflux uropathy: Secondary | ICD-10-CM | POA: Diagnosis not present

## 2022-08-30 DIAGNOSIS — N401 Enlarged prostate with lower urinary tract symptoms: Secondary | ICD-10-CM | POA: Diagnosis not present

## 2022-08-30 LAB — URINALYSIS
Bilirubin, UA: NEGATIVE
Blood, UA: NEGATIVE
Glucose, UA: NEGATIVE mg/dL
Ketones, UA: NEGATIVE
Leukocytes, UA: NEGATIVE
Nitrite, UA: NEGATIVE
Protein, UA: NEGATIVE
Spec Grav, UA: 1.015 (ref 1.010–1.025)
Urobilinogen, UA: 0.2 E.U./dL
pH, UA: 5.5 (ref 5.0–8.0)

## 2022-08-30 LAB — BLADDER SCAN AMB NON-IMAGING

## 2022-08-30 MED ORDER — ALFUZOSIN HCL ER 10 MG PO TB24: 10.0000 mg | ORAL_TABLET | Freq: Every day | ORAL | 11 refills | Status: DC

## 2022-08-30 NOTE — Progress Notes (Signed)
Assessment: 1. Elevated PSA   2. BPH with obstruction/lower urinary tract symptoms     Plan: I personally reviewed the patient's chart including provider notes, and lab results. Today I had a long discussion with the patient regarding PSA and the rationale and controversies of prostate cancer early detection.  Patient expressed his understanding of these issues. Free and total PSA today - will call with results Trial of alfuzosin 10 mg daily.  Rx sent. Return to office in 1 month  Chief Complaint:  Chief Complaint  Patient presents with   Elevated PSA    History of Present Illness:  Clarence Sanders is a 77 y.o. male who is seen in consultation from [place], Malka So, MD for evaluation of elevated PSA. PSA results: 10/22 3.30 9/23 4.22  No prior history of elevated PSA.  No history of UTIs or prostatitis. He has a family history of prostate cancer with his brother undergoing diagnosis and treatment 30 years ago. He has lower urinary tract symptoms including nocturia 3-4 times, occasional postvoid dribbling, intermittent stream, and decreased force of stream, primarily at night.  No dysuria or gross hematuria.  He is not currently taking any medication for his LUTS. IPSS = 8 today.  An interpreter was utilized during the visit today.   Past Medical History:  Past Medical History:  Diagnosis Date   Allergy    Arthritis    Asthma    Hypertension    Hypogonadism male    Thyroid disease    Hypothyroidism   Varicose veins     Past Surgical History:  Past Surgical History:  Procedure Laterality Date   VARICOSE VEIN SURGERY     removed vein in left leg    Allergies:  Allergies  Allergen Reactions   Aspirin Anaphylaxis    Angioedema,    Penicillins Anaphylaxis   Ketorolac Tromethamine     Family History:  Family History  Problem Relation Age of Onset   Other Mother        varicose veins   Arthritis Mother    Asthma Mother    Heart disease Father         before age 21   Heart attack Father    Cancer Sister    Cancer Brother    Arthritis Paternal Grandmother     Social History:  Social History   Tobacco Use   Smoking status: Never   Smokeless tobacco: Never  Vaping Use   Vaping Use: Never used  Substance Use Topics   Alcohol use: Yes    Alcohol/week: 9.0 standard drinks of alcohol    Types: 5 Cans of beer, 4 Shots of liquor per week   Drug use: No    Review of symptoms:  Constitutional:  Negative for unexplained weight loss, night sweats, fever, chills ENT:  Negative for nose bleeds, sinus pain, painful swallowing CV:  Negative for chest pain, shortness of breath, exercise intolerance, palpitations, loss of consciousness Resp:  Negative for cough, wheezing, shortness of breath GI:  Negative for nausea, vomiting, diarrhea, bloody stools GU:  Positives noted in HPI; otherwise negative for gross hematuria, dysuria Neuro:  Negative for seizures, poor balance, limb weakness, slurred speech Psych:  Negative for lack of energy, depression, anxiety Endocrine:  Negative for polydipsia, polyuria, symptoms of hypoglycemia (dizziness, hunger, sweating) Hematologic:  Negative for anemia, purpura, petechia, prolonged or excessive bleeding, use of anticoagulants  Allergic:  Negative for difficulty breathing or choking as a result of exposure to anything;  no shellfish allergy; no allergic response (rash/itch) to materials, foods  Physical exam: BP 134/81   Pulse 66   Ht '5\' 2"'$  (1.575 m)   Wt 176 lb (79.8 kg)   BMI 32.19 kg/m  GENERAL APPEARANCE:  Well appearing, well developed, well nourished, NAD HEENT: Atraumatic, Normocephalic, oropharynx clear. NECK: Supple without lymphadenopathy or thyromegaly. LUNGS: Clear to auscultation bilaterally. HEART: Regular Rate and Rhythm without murmurs, gallops, or rubs. ABDOMEN: Soft, non-tender, No Masses. EXTREMITIES: Moves all extremities well.  Without clubbing, cyanosis, or  edema. NEUROLOGIC:  Alert and oriented x 3, normal gait, CN II-XII grossly intact.  MENTAL STATUS:  Appropriate. BACK:  Non-tender to palpation.  No CVAT SKIN:  Warm, dry and intact.   GU: Penis:  uncircumcised Meatus: Normal Scrotum: fullness on left cord Testis: normal without masses bilateral Prostate: 40 g, NT, no nodules Rectum: Normal tone,  no masses or tenderness   Results: U/A dipstick: negative   PVR:  179 ml

## 2022-08-31 ENCOUNTER — Telehealth: Payer: Self-pay

## 2022-08-31 LAB — PSA, TOTAL AND FREE
PSA, Free Pct: 23.3 %
PSA, Free: 0.84 ng/mL
Prostate Specific Ag, Serum: 3.6 ng/mL (ref 0.0–4.0)

## 2022-08-31 NOTE — Telephone Encounter (Signed)
Using interpreter services, interpreter Valarie Merino [phone], [facility] notifying pt of results and message.

## 2022-08-31 NOTE — Telephone Encounter (Signed)
-----  Message from Primus Bravo, MD sent at 08/31/2022 10:23 AM EST ----- Please notify the patient that his PSA has decreased and is in the normal range. Keep follow-up as scheduled.

## 2022-09-15 ENCOUNTER — Ambulatory Visit: Payer: Medicare Other | Attending: Audiologist | Admitting: Audiologist

## 2022-09-15 DIAGNOSIS — H9313 Tinnitus, bilateral: Secondary | ICD-10-CM | POA: Insufficient documentation

## 2022-09-15 DIAGNOSIS — H903 Sensorineural hearing loss, bilateral: Secondary | ICD-10-CM | POA: Insufficient documentation

## 2022-09-15 NOTE — Procedures (Signed)
  [facility] [place], [place]  [address] [phone]  AUDIOLOGICAL  EVALUATION  NAME: Clarence Sanders     DOB:   Apr 12, 1946      MRN: [id number]                                                                                     DATE: 09/15/2022     REFERENT: [place], Betty G, MD STATUS: Outpatient DIAGNOSIS: Sensorineural Hearing Loss Bilateral, Tinnitus    History: [name] was seen for an audiological evaluation. Interpretor was utilized in person. [name] speaks some english, interpretor clarified when needed. [name] is receiving a hearing evaluation due to concerns for ringing in his ears and hearing loss. [name] has difficulty hearing people unless they talk loudly. This difficulty began gradually. No pain or pressure reported in either ear. Tinnitus present in both ears but louder in the right. 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 [name] has itchy ears, the skin of his ears is dry. Medical history negative for a healthy condition for which is a risk factor for hearing loss. No other relevant case history reported.   Evaluation:  Otoscopy showed a clear view of the tympanic membranes, bilaterally. Dry skin present bilaterally.  Tympanometry results were consistent with normal middle ear function, bilaterally   Audiometric testing was completed using conventional audiometry with insert and supraural transducer. Speech Detection Thresholds were  35dB in the right ear and 50dB in the left ear.  Pure tone thresholds show mild sloping to moderately severe sensorineural hearing loss bilaterally.  Results:  The test results were reviewed with [facility]. He has sensorineural sloping hearing loss. He is a hearing aid candidate. We dicussed the role of hearing loss and noise exposure in creating tinnitus. His high pitched tinnitus is due to the lack of high pitched hearing. Hearing aids can help with  this. He was given his audiogram and a handout on how to use his [employer] benefit.   Recommendations:  Amplification is necessary for both ears. Hearing aids can be purchased from a variety of locations. See provided list for locations in the [place] area. Call [facility] at provided number to set up hearing aid appointment with your benefit.  Use baby oil if ears are are itchy to lubricate the ears. Talk to PCP if this does not help due to possible external canal infection.    36 minutes spent testing and counseling on results.   Alfonse Alpers  Audiologist, Au.D., CCC-A 09/15/2022  9:39 AM  Cc: [place], Betty G, MD

## 2022-09-30 ENCOUNTER — Encounter: Payer: Self-pay | Admitting: Urology

## 2022-09-30 ENCOUNTER — Ambulatory Visit: Payer: Medicare Other | Admitting: Urology

## 2022-09-30 VITALS — BP 125/65 | HR 71 | Ht 63.0 in | Wt 175.0 lb

## 2022-09-30 DIAGNOSIS — N138 Other obstructive and reflux uropathy: Secondary | ICD-10-CM | POA: Diagnosis not present

## 2022-09-30 DIAGNOSIS — N401 Enlarged prostate with lower urinary tract symptoms: Secondary | ICD-10-CM | POA: Diagnosis not present

## 2022-09-30 DIAGNOSIS — R972 Elevated prostate specific antigen [PSA]: Secondary | ICD-10-CM | POA: Diagnosis not present

## 2022-09-30 LAB — URINALYSIS
Bilirubin, UA: NEGATIVE
Blood, UA: NEGATIVE
Glucose, UA: NEGATIVE mg/dL
Ketones, UA: NEGATIVE
Leukocytes, UA: NEGATIVE
Nitrite, UA: NEGATIVE
Protein, UA: NEGATIVE
Spec Grav, UA: <=1.005 — AB (ref 1.010–1.025)
Urobilinogen, UA: 0.2 E.U./dL
pH, UA: 5 (ref 5.0–8.0)

## 2022-09-30 NOTE — Progress Notes (Signed)
Assessment: 1. BPH with obstruction/lower urinary tract symptoms   2. Elevated PSA     Plan: His BPH symptoms have improved with medical therapy.   Discussed PSA results from last visit. Continue alfuzosin 10 mg daily.   Return to office in 6 months  Chief Complaint:  Chief Complaint  Patient presents with   Elevated PSA   Benign Prostatic Hypertrophy    History of Present Illness:  Clarence Sanders is a 77 y.o. male who is seen for further evaluation of elevated PSA and BPH with obstruction. PSA results: 10/22 3.30 9/23 4.22  No prior history of elevated PSA.  No history of UTIs or prostatitis. He has a family history of prostate cancer with his brother undergoing diagnosis and treatment 30 years ago. He has lower urinary tract symptoms including nocturia 3-4 times, occasional postvoid dribbling, intermittent stream, and decreased force of stream, primarily at night.  No dysuria or gross hematuria.  He was not taking any medication for his LUTS. IPSS = 8 .  PVR = 179 ml.  PSA from 1/24: 3 6 with 23% free. He was given a trial of alfuzosin 10 mg daily for his urinary symptoms and incomplete emptying.  He returns today for follow-up.  He reports significant improvement in his lower urinary tract symptoms with alfuzosin.  He has decreased nocturia to 1 time per night, improved stream.  No side effects.  No dysuria or gross hematuria.  An interpreter was utilized during the visit today.  Portions of the above documentation were copied from a prior visit for review purposes only.   Past Medical History:  Past Medical History:  Diagnosis Date   Allergy    Arthritis    Asthma    Hypertension    Hypogonadism male    Thyroid disease    Hypothyroidism   Varicose veins     Past Surgical History:  Past Surgical History:  Procedure Laterality Date   VARICOSE VEIN SURGERY     removed vein in left leg    Allergies:  Allergies  Allergen Reactions   Aspirin  Anaphylaxis    Angioedema,    Penicillins Anaphylaxis   Ketorolac Tromethamine     Family History:  Family History  Problem Relation Age of Onset   Other Mother        varicose veins   Arthritis Mother    Asthma Mother    Heart disease Father        before age 77   Heart attack Father    Cancer Sister    Cancer Brother    Arthritis Paternal Grandmother     Social History:  Social History   Tobacco Use   Smoking status: Never   Smokeless tobacco: Never  Vaping Use   Vaping Use: Never used  Substance Use Topics   Alcohol use: Yes    Alcohol/week: 9.0 standard drinks of alcohol    Types: 5 Cans of beer, 4 Shots of liquor per week   Drug use: No    ROS: Constitutional:  Negative for fever, chills, weight loss CV: Negative for chest pain, previous MI, hypertension Respiratory:  Negative for shortness of breath, wheezing, sleep apnea, frequent cough GI:  Negative for nausea, vomiting, bloody stool, GERD  Physical exam: BP 125/65   Pulse 71   Ht 5' 3"$  (1.6 m)   Wt 175 lb (79.4 kg)   BMI 31.00 kg/m  GENERAL APPEARANCE:  Well appearing, well developed, well nourished, NAD HEENT:  Atraumatic, normocephalic, oropharynx clear NECK:  Supple without lymphadenopathy or thyromegaly ABDOMEN:  Soft, non-tender, no masses EXTREMITIES:  Moves all extremities well, without clubbing, cyanosis, or edema NEUROLOGIC:  Alert and oriented x 3, normal gait, CN II-XII grossly intact MENTAL STATUS:  appropriate BACK:  Non-tender to palpation, No CVAT SKIN:  Warm, dry, and intact   Results: U/A dipstick: negative

## 2022-10-14 DIAGNOSIS — G4733 Obstructive sleep apnea (adult) (pediatric): Secondary | ICD-10-CM | POA: Diagnosis not present

## 2022-11-09 ENCOUNTER — Other Ambulatory Visit: Payer: Self-pay | Admitting: Family Medicine

## 2022-11-09 NOTE — Telephone Encounter (Addendum)
Prescription Request  11/09/2022  LOV: 08/23/2022  What is the name of the medication or equipment? Montelukast  Pt is completely out of this Rx.  I do not see it on his Med List.  Have you contacted your pharmacy to request a refill? Yes   Which pharmacy would you like this sent to?  [facility] # [place], [place] [address] Phone: [phone] Fax: [phone]    Patient notified that their request is being sent to the clinical staff for review and that they should receive a response within 2 business days.   Please advise at Mobile [phone] (mobile)

## 2022-11-09 NOTE — Progress Notes (Unsigned)
   ACUTE VISIT No chief complaint on file.  HPI: [name] is a 77 y.o. male, who is here today complaining of *** HPI  Review of Systems See other pertinent positives and negatives in HPI.  Current Outpatient Medications on File Prior to Visit  Medication Sig Dispense Refill  . alfuzosin (UROXATRAL) 10 MG 24 hr tablet Take 1 tablet (10 mg total) by mouth daily. 30 tablet 11  . amLODipine-olmesartan (AZOR) 5-40 MG tablet Take 1 tablet by mouth daily. 90 tablet 3  . esomeprazole (NEXIUM) 40 MG capsule Take 40 mg by mouth daily at 12 noon.    Marland Kitchen levothyroxine (SYNTHROID) 150 MCG tablet 0.5 tab Tuesdays and Thursdays, 1 tab rest of days. 90 tablet 3  . rosuvastatin (CRESTOR) 10 MG tablet Take 1 tablet (10 mg total) by mouth daily. 90 tablet 3   No current facility-administered medications on file prior to visit.    Past Medical History:  Diagnosis Date  . Allergy   . Arthritis   . Asthma   . Hypertension   . Hypogonadism male   . Thyroid disease    Hypothyroidism  . Varicose veins    Allergies  Allergen Reactions  . Aspirin Anaphylaxis    Angioedema,   . Penicillins Anaphylaxis  . Ketorolac Tromethamine     Social History   Socioeconomic History  . Marital status: Married    Spouse name: Not on file  . Number of children: 2  . Years of education: Not on file  . Highest education level: Not on file  Occupational History  . Not on file  Tobacco Use  . Smoking status: Never  . Smokeless tobacco: Never  Vaping Use  . Vaping Use: Never used  Substance and Sexual Activity  . Alcohol use: Yes    Alcohol/week: 9.0 standard drinks of alcohol    Types: 5 Cans of beer, 4 Shots of liquor per week  . Drug use: No  . Sexual activity: Not Currently  Other Topics Concern  . Not on file  Social History Narrative  . Not on file   Social Determinants of Health   Financial Resource Strain: Low Risk  (05/25/2022)   Overall Financial Resource Strain (CARDIA)    . Difficulty of Paying Living Expenses: Not hard at all  Food Insecurity: No Food Insecurity (05/25/2022)   Hunger Vital Sign   . Worried About Charity fundraiser in the Last Year: Never true   . Ran Out of Food in the Last Year: Never true  Transportation Needs: No Transportation Needs (05/25/2022)   PRAPARE - Transportation   . Lack of Transportation (Medical): No   . Lack of Transportation (Non-Medical): No  Physical Activity: Sufficiently Active (05/25/2022)   Exercise Vital Sign   . Days of Exercise per Week: 7 days   . Minutes of Exercise per Session: 90 min  Stress: No Stress Concern Present (05/25/2022)   [place]   . Feeling of Stress : Only a little  Social Connections: Not on file    There were no vitals filed for this visit. There is no height or weight on file to calculate BMI.  Physical Exam  ASSESSMENT AND PLAN: There are no diagnoses linked to this encounter.  No follow-ups on file.  Taeja Debellis G. Martinique, MD  [facility]. [place] office.  Discharge Instructions   None

## 2022-11-10 ENCOUNTER — Ambulatory Visit (INDEPENDENT_AMBULATORY_CARE_PROVIDER_SITE_OTHER): Payer: Medicare Other | Admitting: Family Medicine

## 2022-11-10 ENCOUNTER — Encounter: Payer: Self-pay | Admitting: Family Medicine

## 2022-11-10 VITALS — BP 90/50 | HR 70 | Temp 98.5°F | Resp 16 | Ht 63.0 in | Wt 177.6 lb

## 2022-11-10 DIAGNOSIS — I10 Essential (primary) hypertension: Secondary | ICD-10-CM | POA: Diagnosis not present

## 2022-11-10 DIAGNOSIS — M159 Polyosteoarthritis, unspecified: Secondary | ICD-10-CM

## 2022-11-10 DIAGNOSIS — M79604 Pain in right leg: Secondary | ICD-10-CM

## 2022-11-10 DIAGNOSIS — M79605 Pain in left leg: Secondary | ICD-10-CM | POA: Diagnosis not present

## 2022-11-10 DIAGNOSIS — J301 Allergic rhinitis due to pollen: Secondary | ICD-10-CM

## 2022-11-10 DIAGNOSIS — J4520 Mild intermittent asthma, uncomplicated: Secondary | ICD-10-CM | POA: Diagnosis not present

## 2022-11-10 DIAGNOSIS — I700 Atherosclerosis of aorta: Secondary | ICD-10-CM

## 2022-11-10 MED ORDER — DULOXETINE HCL 20 MG PO CPEP
20.0000 mg | ORAL_CAPSULE | Freq: Every day | ORAL | 1 refills | Status: DC
Start: 2022-11-10 — End: 2022-11-24

## 2022-11-10 MED ORDER — AMLODIPINE-OLMESARTAN 5-40 MG PO TABS
0.5000 | ORAL_TABLET | Freq: Every day | ORAL | 3 refills | Status: DC
Start: 2022-11-10 — End: 2023-02-21

## 2022-11-10 MED ORDER — MONTELUKAST SODIUM 10 MG PO TABS: 10.0000 mg | ORAL_TABLET | Freq: Every day | ORAL | 1 refills | Status: DC

## 2022-11-10 NOTE — Patient Instructions (Signed)
A few things to remember from today's visit:  Essential (primary) hypertension  Atherosclerosis of aorta, Chronic  Mild intermittent asthma without complication - Plan: montelukast (SINGULAIR) 10 MG tablet  Generalized osteoarthritis of multiple sites - Plan: DULoxetine (CYMBALTA) 20 MG capsule  Pain in both lower extremities - Plan: DULoxetine (CYMBALTA) 20 MG capsule  Seasonal allergic rhinitis due to pollen - Plan: montelukast (SINGULAIR) 10 MG tablet  Benign essential hypertension - Plan: amLODipine-olmesartan (AZOR) 5-40 MG tablet  La presion arterial esta baja, disminuya la dosis del medicamento para la presion, de 1 tan a 1/2 tab diaria. Singulair 10 mg en la noche durante spring. Duloxetine para el dolor de las articulaciones, puede tratar de aumentarla a 2 cap en 4 semanas. Si el dolor de las piernes continua va a necesitar una radiografia de la columna, MRI.  Muy buena suete en [place], los voy a extranar!!!

## 2022-11-11 MED ORDER — FLUTICASONE-SALMETEROL 250-50 MCG/ACT IN AEPB
1.0000 | INHALATION_SPRAY | Freq: Two times a day (BID) | RESPIRATORY_TRACT | 2 refills | Status: DC
Start: 2022-11-11 — End: 2022-12-24

## 2022-11-11 NOTE — Assessment & Plan Note (Signed)
Mild hypotension and symptoms suggestive of orthostatic hypotension. Recommend decreasing dose of Amlodipine-Olmesartan 5-20 mg from 1 tab to 1/2 tab daily. Continue monitoring BP but daily. Low salt diet. He is moving to [place] in a few weeks, instructed to arrange f/u with new PCP in 2 months.

## 2022-11-11 NOTE — Assessment & Plan Note (Signed)
Seen on abdominal US 05/2021. Continue Rosuvastatin 10 mg daily.

## 2022-11-11 NOTE — Assessment & Plan Note (Signed)
We discussed Dx,prognosis,and treatment options. Duloxetine 30 mg caused drowsiness, he would like to try lower dose, 20 mg daily. Can increase dose to 40 mg in 3-4 weeks if well tolerated. Tylenol 500 mg 3-4 times daily as needed. F/U in 2 months.

## 2022-11-11 NOTE — Assessment & Plan Note (Signed)
Mildly worse since Spring started. Singulair 10 mg added today. Continue Advair 250-50 mcg and Albuterol inh same dose.

## 2022-11-11 NOTE — Assessment & Plan Note (Signed)
This is a chronic problem. We discussed possible etiologies, some of his comorbidities could be contributing factors:  Vein disease, radicular pain, and OA.  Duloxetine 20 mg started today. If persistent, lumbar MRI may be necessary.

## 2022-11-11 NOTE — Assessment & Plan Note (Signed)
Problem is not well controlled. OTC antihistaminics have not helped. Singulair 10 mg daily to take during seasons when his symptoms are worse.

## 2022-11-15 ENCOUNTER — Emergency Department (HOSPITAL_COMMUNITY)
Admission: EM | Admit: 2022-11-15 | Discharge: 2022-11-15 | Disposition: A | Discharge status: Home / Self Care | Payer: Medicare Other | Attending: Emergency Medicine | Admitting: Emergency Medicine

## 2022-11-15 ENCOUNTER — Emergency Department (HOSPITAL_COMMUNITY): Payer: Medicare Other

## 2022-11-15 ENCOUNTER — Ambulatory Visit (HOSPITAL_COMMUNITY)
Admission: EM | Admit: 2022-11-15 | Discharge: 2022-11-15 | Disposition: A | Discharge status: Home / Self Care | Payer: Medicare Other | Attending: Internal Medicine | Admitting: Internal Medicine

## 2022-11-15 ENCOUNTER — Encounter (HOSPITAL_COMMUNITY): Payer: Self-pay | Admitting: Emergency Medicine

## 2022-11-15 DIAGNOSIS — R0609 Other forms of dyspnea: Secondary | ICD-10-CM | POA: Diagnosis not present

## 2022-11-15 DIAGNOSIS — J45909 Unspecified asthma, uncomplicated: Secondary | ICD-10-CM | POA: Insufficient documentation

## 2022-11-15 DIAGNOSIS — R0789 Other chest pain: Secondary | ICD-10-CM | POA: Diagnosis not present

## 2022-11-15 DIAGNOSIS — R0600 Dyspnea, unspecified: Secondary | ICD-10-CM

## 2022-11-15 DIAGNOSIS — R079 Chest pain, unspecified: Secondary | ICD-10-CM | POA: Insufficient documentation

## 2022-11-15 DIAGNOSIS — K449 Diaphragmatic hernia without obstruction or gangrene: Secondary | ICD-10-CM | POA: Diagnosis not present

## 2022-11-15 DIAGNOSIS — R0602 Shortness of breath: Secondary | ICD-10-CM | POA: Diagnosis not present

## 2022-11-15 LAB — COMPREHENSIVE METABOLIC PANEL
ALT: 23 U/L (ref 0–44)
AST: 23 U/L (ref 15–41)
Albumin: 4 g/dL (ref 3.5–5.0)
Alkaline Phosphatase: 61 U/L (ref 38–126)
Anion gap: 12 (ref 5–15)
BUN: 17 mg/dL (ref 8–23)
CO2: 24 mmol/L (ref 22–32)
Calcium: 9.3 mg/dL (ref 8.9–10.3)
Chloride: 102 mmol/L (ref 98–111)
Creatinine, Ser: 0.85 mg/dL (ref 0.61–1.24)
GFR, Estimated: >60 mL/min (ref >60)
Glucose, Bld: 100 mg/dL — ABNORMAL HIGH (ref 70–99)
Potassium: 4.3 mmol/L (ref 3.5–5.1)
Sodium: 138 mmol/L (ref 135–145)
Total Bilirubin: 0.5 mg/dL (ref 0.3–1.2)
Total Protein: 6.8 g/dL (ref 6.5–8.1)

## 2022-11-15 LAB — BRAIN NATRIURETIC PEPTIDE: B Natriuretic Peptide: 43.3 pg/mL (ref 0.0–100.0)

## 2022-11-15 LAB — TROPONIN I (HIGH SENSITIVITY)
Troponin I (High Sensitivity): 4 ng/L (ref <18)
Troponin I (High Sensitivity): 3 ng/L (ref <18)

## 2022-11-15 LAB — CBC
HCT: 40.9 % (ref 39.0–52.0)
Hemoglobin: 13.7 g/dL (ref 13.0–17.0)
MCH: 30.7 pg (ref 26.0–34.0)
MCHC: 33.5 g/dL (ref 30.0–36.0)
MCV: 91.7 fL (ref 80.0–100.0)
Platelets: 306 10*3/uL (ref 150–400)
RBC: 4.46 MIL/uL (ref 4.22–5.81)
RDW: 11.9 % (ref 11.5–15.5)
WBC: 6.4 10*3/uL (ref 4.0–10.5)
nRBC: 0 % (ref 0.0–0.2)

## 2022-11-15 LAB — D-DIMER, QUANTITATIVE: D-Dimer, Quant: 0.37 ug/mL-FEU (ref 0.00–0.50)

## 2022-11-15 NOTE — ED Triage Notes (Signed)
Used interpretor  Pt adds that having "terrible noise in bilateral ears that is causing dizziness". Reports this is has been intermittent for about 6 months but has become more constant. Pt does have a PCP and made aware of the noise in ears and told no medications that can help.     Few days ago started having intermittent having pain right side chest pains, SOB on right side of chest. Pt feels this way when goes to gym and lifts or yesterday after walking 2 blocks.

## 2022-11-15 NOTE — ED Provider Notes (Signed)
[facility]    CSN: [id number] Arrival date & time: 11/15/22  1024      History   Chief Complaint Chief Complaint  Patient presents with  . Tinnitus    HPI Clarence Sanders is a 77 y.o. male.   HPI  Past Medical History:  Diagnosis Date  . Allergy   . Arthritis   . Asthma   . Hypertension   . Hypogonadism male   . Thyroid disease    Hypothyroidism  . Varicose veins     Patient Active Problem List   Diagnosis Date Noted  . Seasonal allergic rhinitis due to pollen 11/10/2022  . Elevated PSA 08/30/2022  . Tinnitus of both ears 08/23/2022  . Sensorineural hearing loss (SNHL) of both ears 08/23/2022  . Pain in both lower extremities 07/07/2022  . Groin pain, chronic, right 07/07/2022  . BPH with obstruction/lower urinary tract symptoms 07/07/2022  . Hyperkalemia 07/07/2022  . Rectal fullness 07/07/2022  . Hyperuricemia 05/07/2022  . 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 Prediabetes 01/05/2022  . Atherosclerosis of aorta 05/27/2021  . Anxiety disorder, unspecified 02/13/2020  . 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 Routine general medical examination at a health care facility 11/15/2016  . Mild intermittent asthma without complication 03/04/2016  . Barrett's esophagus without dysplasia 03/04/2016  . Gastroesophageal reflux disease 08/12/2015  . Essential (primary) hypertension 08/12/2015  . Varicose veins of lower extremities with other complications 02/07/2012  . ASTHMA 11/14/2007  . Generalized osteoarthritis of multiple sites 11/14/2007  . COLONIC POLYPS 07/21/2007  . Hypothyroidism (acquired) 07/21/2007  . HYPOGONADISM, MALE 07/21/2007  . Hyperlipidemia 07/21/2007  . VARICOCELE 07/21/2007  . 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 VARICOSE VEIN SURGERY     removed vein in left leg       Home Medications    Prior to Admission medications    Medication Sig Start Date End Date Taking? Authorizing Provider  budesonide-formoterol (SYMBICORT) 160-4.5 MCG/ACT inhaler Inhale 2 puffs into the lungs in the morning and at bedtime. 10/22/20  Yes [provider]  albuterol (VENTOLIN HFA) 108 (90 Base) MCG/ACT inhaler Inhale into the lungs every 6 (six) hours as needed for wheezing or shortness of breath.    [provider]  alfuzosin (UROXATRAL) 10 MG 24 hr tablet Take 1 tablet (10 mg total) by mouth daily. 08/30/22   [place], Danford Bad., MD  amLODipine-olmesartan (AZOR) 5-40 MG tablet Take 0.5 tablets by mouth daily. 11/10/22   [place], Betty G, MD  DULoxetine (CYMBALTA) 20 MG capsule Take 1 capsule (20 mg total) by mouth daily. 11/10/22   [place], Betty G, MD  esomeprazole (NEXIUM) 40 MG capsule Take 40 mg by mouth daily at 12 noon.    [provider]  fluticasone-salmeterol (ADVAIR) 250-50 MCG/ACT AEPB Inhale 1 puff into the lungs in the morning and at bedtime. 11/11/22   [place], Betty G, MD  levothyroxine (SYNTHROID) 150 MCG tablet 0.5 tab Tuesdays and Thursdays, 1 tab rest of days. 07/13/22   [place], Betty G, MD  montelukast (SINGULAIR) 10 MG tablet Take 1 tablet (10 mg total) by mouth at bedtime. 11/10/22   [place], Betty G, MD  rosuvastatin (CRESTOR) 10 MG tablet Take 1 tablet (10 mg total) by mouth daily. 01/05/22   [place], Betty G, MD    Family History Family History  Problem Relation Age of Onset  . Other Mother  varicose veins  . Arthritis Mother   . Asthma Mother   . Heart disease Father        before age 80  . Heart attack Father   . Cancer Sister   . Cancer Brother   . Arthritis Paternal Grandmother     Social History Social History   Tobacco Use  . Smoking status: Never  . Smokeless tobacco: Never  Vaping Use  . Vaping Use: Never used  Substance Use Topics  . Alcohol use: Yes    Alcohol/week: 9.0 standard drinks of alcohol    Types: 5 Cans of beer, 4 Shots of liquor per week  . Drug use:  No     Allergies   Aspirin, Penicillins, and Ketorolac tromethamine   Review of Systems Review of Systems   Physical Exam Triage Vital Signs ED Triage Vitals  Enc Vitals Group     BP 11/15/22 1237 120/71     Pulse Rate 11/15/22 1237 62     Resp 11/15/22 1237 17     Temp 11/15/22 1237 (!) 97.5 F (36.4 C)     Temp Source 11/15/22 1237 Oral     SpO2 11/15/22 1237 94 %     Weight --      Height --      Head Circumference --      Peak Flow --      Pain Score 11/15/22 1232 0     Pain Loc --      Pain Edu? --      Excl. in GC? --    No data found.  Updated Vital Signs BP 120/71 (BP Location: Right Arm)   Pulse 62   Temp (!) 97.5 F (36.4 C) (Oral)   Resp 17   SpO2 94%   Visual Acuity Right Eye Distance:   Left Eye Distance:   Bilateral Distance:    Right Eye Near:   Left Eye Near:    Bilateral Near:     Physical Exam   UC Treatments / Results  Labs (all labs ordered are listed, but only abnormal results are displayed) Labs Reviewed - No data to display  EKG   Radiology No results found.  Procedures Procedures (including critical care time)  Medications Ordered in UC Medications - No data to display  Initial Impression / Assessment and Plan / UC Course  I have reviewed the triage vital signs and the nursing notes.  Pertinent labs & imaging results that were available during my care of the patient were reviewed by me and considered in my medical decision making (see chart for details).     *** Final Clinical Impressions(s) / UC Diagnoses   Final diagnoses:  None   Discharge Instructions   None    ED Prescriptions   None    PDMP not reviewed this encounter.

## 2022-11-15 NOTE — ED Notes (Signed)
Patient is being discharged from the Urgent Care and sent to the Emergency Department via POV. Per Dr Leonides Grills, patient is in need of higher level of care due to chest pain and exertional Shortness of breath. Patient is aware and verbalizes understanding of plan of care.  Vitals:   11/15/22 1237  BP: 120/71  Pulse: 62  Resp: 17  Temp: (!) 97.5 F (36.4 C)  SpO2: 94%

## 2022-11-15 NOTE — Discharge Instructions (Addendum)
No signs of heart attack or blood clots in your lung during your visit today. Follow-up with primary doctor and cardiologist. Return for persistent chest pain, passing out, worsening shortness of breath or new concerns. You can take baby aspirin a day until you see heart doctor.  No hay signos de ataque cardaco o cogulos de sangre en su pulmn durante su visita de hoy. Seguimiento con mdico de atencin primaria y Aeronautical engineer. Regrese si tiene Journalist, newspaper persistente, desmayo, dificultad para respirar que empeora o si tiene nuevas preocupaciones. Puede tomar aspirina para bebs al da hasta que consulte a su cardilogo.

## 2022-11-15 NOTE — ED Provider Notes (Signed)
Clarence Sanders Provider Note   CSN: [id number] Arrival date & time: 11/15/22  1336     History  Chief Complaint  Patient presents with   Chest Pain    Clarence RichesJose Guillermo Donnamarie Sanders is a 77 y.o. male.  Patient with history of high blood pressure, prediabetes, asthma presents with feeling short of breath and bilateral arm tingling and discomfort started 2 to 3 days ago.  No history of known heart problems no heart attack history, no blood clot history.  No unilateral weakness.  Normal gait.  Patient speaks Spanish interpreter used during entire discussion.  No fevers or chills.  No headache.  No vomiting.  No chest pressure or pain.  No recent stress test.  No recent surgeries.  Patient has had mild weight gain lately.       Home Medications Prior to Admission medications   Medication Sig Start Date End Date Taking? Authorizing Provider  albuterol (VENTOLIN HFA) 108 (90 Base) MCG/ACT inhaler Inhale into the lungs every 6 (six) hours as needed for wheezing or shortness of breath.    [provider]  alfuzosin (UROXATRAL) 10 MG 24 hr tablet Take 1 tablet (10 mg total) by mouth daily. 08/30/22   Stoneking, Danford BadBradley J., MD  amLODipine-olmesartan (AZOR) 5-40 MG tablet Take 0.5 tablets by mouth daily. 11/10/22   [place]Jordan, Betty G, MD  budesonide-formoterol [facility](SYMBICORT) 160-4.5 MCG/ACT inhaler Inhale 2 puffs into the lungs in the morning and at bedtime. 10/22/20   [provider]  DULoxetine (CYMBALTA) 20 MG capsule Take 1 capsule (20 mg total) by mouth daily. 11/10/22   [place]andJordan, Betty G, MD  esomeprazole (NEXIUM) 40 MG capsule Take 40 mg by mouth daily at 12 noon.    [provider]  fluticasone-salmeterol (ADVAIR) 250-50 MCG/ACT AEPB Inhale 1 puff into the lungs in the morning and at bedtime. 11/11/22   [place]Jordan, Betty G, MD  levothyroxine (SYNTHROID) 150 MCG tablet 0.5 tab Tuesdays and Thursdays, 1 tab rest of days. 07/13/22   [place], Betty G, MD   montelukast (SINGULAIR) 10 MG tablet Take 1 tablet (10 mg total) by mouth at bedtime. 11/10/22   [place], Betty G, MD  rosuvastatin (CRESTOR) 10 MG tablet Take 1 tablet (10 mg total) by mouth daily. 01/05/22   [place], Betty G, MD      Allergies    Aspirin, Penicillins, and Ketorolac tromethamine    Review of Systems   Review of Systems  Constitutional:  Negative for chills and fever.  HENT:  Negative for congestion.   Eyes:  Negative for visual disturbance.  Respiratory:  Positive for shortness of breath.   Cardiovascular:  Negative for chest pain.  Gastrointestinal:  Negative for abdominal pain and vomiting.  Genitourinary:  Negative for dysuria and flank pain.  Musculoskeletal:  Negative for back pain, neck pain and neck stiffness.  Skin:  Negative for rash.  Neurological:  Negative for light-headedness and headaches.    Physical Exam Updated Vital Signs BP 118/80   Pulse (!) 59   Temp 98.1 F (36.7 C) (Oral)   Resp 13   SpO2 96%  Physical Exam Vitals and nursing note reviewed.  Constitutional:      General: He is not in acute distress.    Appearance: He is well-developed.  HENT:     Head: Normocephalic and atraumatic.     Mouth/Throat:     Mouth: Mucous membranes are moist.  Eyes:     General:  Right eye: No discharge.        Left eye: No discharge.     Conjunctiva/sclera: Conjunctivae normal.  Neck:     Trachea: No tracheal deviation.  Cardiovascular:     Rate and Rhythm: Normal rate and regular rhythm.     Heart sounds: No murmur heard. Pulmonary:     Effort: Pulmonary effort is normal.     Breath sounds: Normal breath sounds.  Abdominal:     General: There is no distension.     Palpations: Abdomen is soft.     Tenderness: There is no abdominal tenderness. There is no guarding.  Musculoskeletal:     Cervical back: Normal range of motion and neck supple. No rigidity.     Right lower leg: No tenderness. No edema.     Left lower leg: No tenderness. No  edema.  Skin:    General: Skin is warm.     Capillary Refill: Capillary refill takes less than 2 seconds.     Findings: No rash.  Neurological:     General: No focal deficit present.     Mental Status: He is alert.     Cranial Nerves: No cranial nerve deficit.  Psychiatric:        Mood and Affect: Mood normal.     ED Results / Procedures / Treatments   Labs (all labs ordered are listed, but only abnormal results are displayed) Labs Reviewed  COMPREHENSIVE METABOLIC PANEL - Abnormal; Notable for the following components:      Result Value   Glucose, Bld 100 (*)    All other components within normal limits  CBC  BRAIN NATRIURETIC PEPTIDE  D-DIMER, QUANTITATIVE (NOT AT [facility])  TROPONIN I (HIGH SENSITIVITY)  TROPONIN I (HIGH SENSITIVITY)    EKG EKG Interpretation  Date/Time:  Monday November 15 2022 14:37:55 EDT Ventricular Rate:  57 PR Interval:  185 QRS Duration: 108 QT Interval:  426 QTC Calculation: 415 R Axis:   42 Text Interpretation: Sinus rhythm Abnormal R-wave progression, early transition Inferior infarct, old Confirmed by Blane Ohara [phone]) on 11/15/2022 3:20:25 PM  Radiology DG Chest 2 View  Result Date: 11/15/2022 CLINICAL DATA:  Chest pain EXAM: CHEST - 2 VIEW COMPARISON:  05/07/2022 FINDINGS: Heart size is normal. There is probably a small hiatal hernia. The lungs are clear. No infiltrate, collapse or effusion. Minimal chronic blunting of the posterior costophrenic angles. No abnormal bone finding. IMPRESSION: No active disease. Small hiatal hernia. Minimal chronic blunting of the posterior costophrenic angles. Electronically Signed   By: Paulina Fusi M.D.   On: 11/15/2022 14:13    Procedures Procedures    Medications Ordered in ED Medications - No data to display  ED Course/ Medical Decision Making/ A&P                             Medical Decision Making Amount and/or Complexity of Data Reviewed Labs: ordered. Radiology: ordered.   Patient  presents with arm pain and breathing difficulty at times worse with ambulation and mild weight gain.  Differential includes lung disease, ACS equivalent, pulm embolism, heart failure, other.  No signs of anemia on exam, general blood work ordered and independently reviewed hemoglobin normal, troponin negative no signs of acute ACS, electrolytes and kidney function unremarkable.  D-dimer, BNP and troponin repeat pending.  Cardiac referral placed.  Chest x-ray independently reviewed no acute infiltrate or pleural effusion or cardiomegaly.  Blood pressure normal in  the ER.  No signs or symptoms at this time.  Interpreter used during discussion.  Troponin, D-dimer and BNP all normal.  No signs of blood clot heart attack or heart failure.  Patient vital signs stable.  Patient stable for follow-up with cardiology and primary doctor later this week.        Final Clinical Impression(s) / ED Diagnoses Final diagnoses:  Acute dyspnea    Rx / DC Orders ED Discharge Orders          Ordered    Ambulatory referral to Cardiology       Comments: If you have not heard from the Cardiology office within the next 72 hours please call [phone].   11/15/22 2836              Blane Ohara, MD 11/15/22 [phone]

## 2022-11-15 NOTE — ED Triage Notes (Signed)
Patient here for evaluation of chest pain, dizziness, shortness of breath, and bilateral arm pain that started a few days ago. Seen at urgent care for same earlier today, referred to ED for evaluation. Patient states he has no pain today. Patient is alert, oriented, ambulating independently with steady gait and is in no apparent distress at this time.

## 2022-11-15 NOTE — Discharge Instructions (Addendum)
Please go to the emergency department for further evaluation.

## 2022-11-16 ENCOUNTER — Encounter: Payer: Self-pay | Admitting: Internal Medicine

## 2022-11-17 ENCOUNTER — Ambulatory Visit: Payer: Medicare Other | Admitting: Internal Medicine

## 2022-11-18 ENCOUNTER — Telehealth: Payer: Self-pay

## 2022-11-18 NOTE — Telephone Encounter (Signed)
        Patient  visited  on 4/8    Telephone encounter attempt :  2nd  A HIPAA compliant voice message was left requesting a return call.  Instructed patient to call back .   Clarence Sanders [facility] Guide, [place] [phone] [address], [place], [place] [address] Phone: [phone] Email: [email or web address]       

## 2022-11-18 NOTE — Telephone Encounter (Signed)
        Patient  visited [address] on 4/8    Telephone encounter attempt :  1st  A HIPAA compliant voice message was left requesting a return call.  Instructed patient to call back .    Sanya Kobrin [facility] Care Guide, [place] [phone] [address], [place], [place] [address] Phone: [phone] Email: [email or web address]       

## 2022-11-23 NOTE — Progress Notes (Unsigned)
Cardiology Office Note:    Date:  11/24/2022   ID:  Clarence Sanders, [place] [birth year], MRN [id number]  PCP:  [place], Clarence G, MD   [facility] Providers Cardiologist:  Christell Constant, MD     Referring MD: Blane Ohara, MD   CC: Chest pain Consulted for the evaluation of DOE at the behest of Dr. Judithe Modest used  History of Present Illness:    Clarence Sanders is a 77 y.o. male with a hx of HTN, Asthma with new SOB  Patient notes that he is feeling new chest pain.   Was last feeling well 4 weeks ago For the last four weeks he has had intermittent right arm and back pain.  Not dependent on activity. He has chest pain in the center of his chest.  Occurred watching TV.  Occurred at rest.  Occurred after eating at a restaurant on 4/8 and went to the ED.  Normal troponin; negative cardiac testing.  Presently no CP or SOB.   No PND or orthopnea.  No weight gain, leg swelling , or abdominal swelling.  No syncope or near syncope . Notes  no palpitations or funny heart beats.     No  prior cardiac testing. Father died of heart failure.  He was unaware of his aortic atherosclerosis diagnosis.  Has HLD, but notes that since his 04/2022 visit had labs that showed improvement.   Past Medical History:  Diagnosis Date   Allergy    Arthritis    Asthma    Dyspnea    Hypertension    Hypogonadism male    Prediabetes    Thyroid disease    Hypothyroidism   Varicose veins     Past Surgical History:  Procedure Laterality Date   VARICOSE VEIN SURGERY     removed vein in left leg    Current Medications: Current Meds  Medication Sig   amLODipine-olmesartan (AZOR) 5-40 MG tablet Take 0.5 tablets by mouth daily.   budesonide-formoterol (SYMBICORT) 160-4.5 MCG/ACT inhaler Inhale 2 puffs into the lungs as needed (shortness of breath).   esomeprazole (NEXIUM) 40 MG capsule Take 40 mg by mouth as needed (heartburn).   fluticasone-salmeterol (ADVAIR)  250-50 MCG/ACT AEPB Inhale 1 puff into the lungs in the morning and at bedtime.   levothyroxine (SYNTHROID) 150 MCG tablet 0.5 tab Tuesdays and Thursdays, 1 tab rest of days.   metoprolol tartrate (LOPRESSOR) 50 MG tablet Take 2 hours prior to Cardiac CT   montelukast (SINGULAIR) 10 MG tablet Take 1 tablet (10 mg total) by mouth at bedtime. (Patient taking differently: Take 10 mg by mouth as needed (allergies).)   nitroGLYCERIN (NITROSTAT) 0.4 MG SL tablet Place 1 tablet (0.4 mg total) under the tongue every 5 (five) minutes as needed for chest pain.   [DISCONTINUED] alfuzosin (UROXATRAL) 10 MG 24 hr tablet Take 1 tablet (10 mg total) by mouth daily.     Allergies:   Aspirin, Penicillins, and Ketorolac tromethamine   Social History   Socioeconomic History   Marital status: Married    Spouse name: Not on file   Number of children: 2   Years of education: Not on file   Highest education level: Not on file  Occupational History   Not on file  Tobacco Use   Smoking status: Never   Smokeless tobacco: Never  Vaping Use   Vaping Use: Never used  Substance and Sexual Activity   Alcohol use: Yes    Alcohol/week: 9.0 standard  drinks of alcohol    Types: 5 Cans of beer, 4 Shots of liquor per week   Drug use: No   Sexual activity: Not Currently  Other Topics Concern   Not on file  Social History Narrative   Not on file   Social Determinants of Health   Financial Resource Strain: Low Risk  (05/25/2022)   Overall Financial Resource Strain (CARDIA)    Difficulty of Paying Living Expenses: Not hard at all  Food Insecurity: No Food Insecurity (05/25/2022)   Hunger Vital Sign    Worried About Running Out of Food in the Last Year: Never true    Ran Out of Food in the Last Year: Never true  Transportation Needs: No Transportation Needs (05/25/2022)   PRAPARE - Administrator, Civil Service (Medical): No    Lack of Transportation (Non-Medical): No  Physical Activity:  Sufficiently Active (05/25/2022)   Exercise Vital Sign    Days of Exercise per Week: 7 days    Minutes of Exercise per Session: 90 min  Stress: No Stress Concern Present (05/25/2022)   [employer] of Occupational Health - Occupational Stress Questionnaire    Feeling of Stress : Only a little  Social Connections: Not on file     Family History: The patient's family history includes Arthritis in his mother and paternal grandmother; Asthma in his mother; Cancer in his brother and sister; Heart attack in his father; Heart disease in his father; Other in his mother.  ROS:   Please see the history of present illness.    EKGs/Labs/Other Studies Reviewed:    The following studies were reviewed today:   EKG:   11/24/22: Sinus bradycardia with inferior infarct pattern    Recent Labs: 08/23/2022: TSH 5.32 11/15/2022: ALT 23; B Natriuretic Peptide 43.3; BUN 17; Creatinine, Ser 0.85; Hemoglobin 13.7; Platelets 306; Potassium 4.3; Sodium 138  Recent Lipid Panel    Component Value Date/Time   CHOL 208 (H) 05/07/2022 0916   CHOL 211 (H) 11/15/2016 0855   TRIG 59.0 05/07/2022 0916   HDL 63.30 05/07/2022 0916   HDL 60 11/15/2016 0855   CHOLHDL 3 05/07/2022 0916   VLDL 11.8 05/07/2022 0916   LDLCALC 133 (H) 05/07/2022 0916   LDLCALC 137 (H) 11/15/2016 0855        Physical Exam:    VS:  BP (!) 100/58   Pulse 64   Ht 5\' 3"  (1.6 m)   Wt 179 lb (81.2 kg)   SpO2 95%   BMI 31.71 kg/m     Wt Readings from Last 3 Encounters:  11/24/22 179 lb (81.2 kg)  11/10/22 177 lb 9.6 oz (80.6 kg)  09/30/22 175 lb (79.4 kg)     GEN:  Well nourished, well developed in no acute distress HEENT: Bilateral Frank's Sign NECK: No JVD; No carotid bruits LYMPHATICS: No lymphadenopathy CARDIAC: RRR, no murmurs, rubs, gallops RESPIRATORY:  Clear to auscultation without rales, wheezing or rhonchi  ABDOMEN: Soft, non-tender, non-distended MUSCULOSKELETAL:  No edema; No deformity  SKIN: Warm and  dry NEUROLOGIC:  Alert and oriented x 3 PSYCHIATRIC:  Normal affect   ASSESSMENT:    1. Precordial pain   2. Atherosclerosis of aorta   3. Mixed hyperlipidemia    PLAN:    Precordial CP HLD Aortic atherosclerosis - Will give PRN Nitro - will get CCTA - If obstructive disease, will start medical therapy and bring back for follow up - if non obstructive disease, would likely resume statin  Summer f/u with team       Medication Adjustments/Labs and Tests Ordered: Current medicines are reviewed at length with the patient today.  Concerns regarding medicines are outlined above.  Orders Placed This Encounter  Procedures   CT CORONARY MORPH W/CTA COR W/SCORE W/CA W/CM &/OR WO/CM   Meds ordered this encounter  Medications   nitroGLYCERIN (NITROSTAT) 0.4 MG SL tablet    Sig: Place 1 tablet (0.4 mg total) under the tongue every 5 (five) minutes as needed for chest pain.    Dispense:  25 tablet    Refill:  3   metoprolol tartrate (LOPRESSOR) 50 MG tablet    Sig: Take 2 hours prior to Cardiac CT    Dispense:  1 tablet    Refill:  0    Patient Instructions  Medication Instructions:  Your physician has recommended you make the following change in your medication:  START: Nitroglycerin 0.4 mg under your tongue as needed for Chest Pain  If you have chest pain place 1 tablet under your tongue, wait 5 min If chest pain continues place a 2nd tablet under your tongue, wait 5 min If chest pain continues place a 3rd tablet under your tongue, wait 5 min If chest pain continues call 911  *If you need a refill on your cardiac medications before your next appointment, please call your pharmacy*   Lab Work: NONE If you have labs (blood work) drawn today and your tests are completely normal, you will receive your results only by: MyChart Message (if you have MyChart) OR A paper copy in the mail If you have any lab test that is abnormal or we need to change your treatment, we will call  you to review the results.   Testing/Procedures: Your physician has requested that you have cardiac CT. Cardiac computed tomography (CT) is a painless test that uses an x-ray machine to take clear, detailed pictures of your heart. For further information please visit [email or web address] Please follow instruction sheet as given.     Follow-Up: At [facility], you and your health needs are our priority.  As part of our continuing mission to provide you with exceptional heart care, we have created designated Provider Care Teams.  These Care Teams include your primary Cardiologist (physician) and Advanced Practice Providers (APPs -  Physician Assistants and Nurse Practitioners) who all work together to provide you with the care you need, when you need it.  We recommend signing up for the patient portal called "MyChart".  Sign up information is provided on this After Visit Summary.  MyChart is used to connect with patients for Virtual Visits (Telemedicine).  Patients are able to view lab/test results, encounter notes, upcoming appointments, etc.  Non-urgent messages can be sent to your provider as well.   To learn more about what you can do with MyChart, go to [email or web address].    Your next appointment:   3-4 month(s)  Provider:   Christell Constant, MD  or Jari Favre, PA-C, Robin Searing, NP, Jacolyn Reedy, PA-C, Eligha Bridegroom, NP, or Tereso Newcomer, PA-C        Other Instructions  Your cardiac CT will be scheduled at one of the below locations:   [facility] [address] [place], [place] [address] [phone]  OR  [facility] [address] [place], [place] [address] [phone]  OR   [facility] [address] [place], [place] [address] [phone]  If scheduled at [facility], please arrive at the [facility] and Children's Entrance (Entrance C2) of [facility] 30 minutes prior to test start time. You can use the FREE valet parking offered at entrance C (encouraged to control the heart rate for the test)  Proceed to the [facility] Radiology Department (first floor) to check-in and test prep.  All radiology patients and guests should use entrance C2 at [facility], accessed from [facility], even though the hospital's physical address listed is [address]    If scheduled at [facility] or [facility], please arrive 15 mins early for check-in and test prep.   Please follow these instructions carefully (unless otherwise directed):  Hold all erectile dysfunction medications at least 3 days (72 hrs) prior to test. (Ie viagra, cialis, sildenafil, tadalafil, etc) We will administer nitroglycerin during this exam.   On the Night Before the Test: Be sure to Drink plenty of water. Do not consume any caffeinated/decaffeinated beverages or chocolate 12 hours prior to your test. Do not take any antihistamines 12 hours prior to your test.   On the Day of the Test: Drink plenty of water until 1 hour prior to the test. Do not eat any food 1 hour prior to test. You may take your regular medications prior to the test.  Take metoprolol (Lopressor) 50 mg two hours prior to test. If you take Furosemide/Hydrochlorothiazide/Spironolactone, please HOLD on the morning of the test.        After the Test: Drink plenty of water. After receiving IV contrast, you may experience a mild flushed feeling. This is normal. On occasion, you may experience a mild rash up to 24 hours after the test. This is not dangerous. If this occurs, you can take Benadryl 25 mg and increase your fluid intake. If you experience trouble breathing, this can be serious. If it is severe call 911 IMMEDIATELY. If it is mild, please call our office. If you take any of these medications: Glipizide/Metformin,  Avandament, Glucavance, please do not take 48 hours after completing test unless otherwise instructed.  We will call to schedule your test 2-4 weeks out understanding that some insurance companies will need an authorization prior to the service being performed.   For non-scheduling related questions, please contact the cardiac imaging nurse navigator should you have any questions/concerns: Rockwell Alexandria, Cardiac Imaging Nurse Navigator Larey Brick, Cardiac Imaging Nurse Navigator [facility] Direct Office Dial: [phone]   For scheduling needs, including cancellations and rescheduling, please call [place], [phone].     Signed, Christell Constant, MD  11/24/2022 12:46 PM    [facility]

## 2022-11-24 ENCOUNTER — Ambulatory Visit: Payer: Medicare Other | Attending: Internal Medicine | Admitting: Internal Medicine

## 2022-11-24 ENCOUNTER — Encounter: Payer: Self-pay | Admitting: Internal Medicine

## 2022-11-24 VITALS — BP 100/58 | HR 64 | Ht 63.0 in | Wt 179.0 lb

## 2022-11-24 DIAGNOSIS — E782 Mixed hyperlipidemia: Secondary | ICD-10-CM | POA: Diagnosis not present

## 2022-11-24 DIAGNOSIS — I700 Atherosclerosis of aorta: Secondary | ICD-10-CM | POA: Diagnosis not present

## 2022-11-24 DIAGNOSIS — R072 Precordial pain: Secondary | ICD-10-CM

## 2022-11-24 MED ORDER — METOPROLOL TARTRATE 50 MG PO TABS: ORAL_TABLET | ORAL | 0 refills | Status: DC

## 2022-11-24 MED ORDER — NITROGLYCERIN 0.4 MG SL SUBL: 0.4000 mg | SUBLINGUAL_TABLET | SUBLINGUAL | 3 refills | Status: AC | PRN

## 2022-11-24 NOTE — Patient Instructions (Signed)
Medication Instructions:  Your physician has recommended you make the following change in your medication:  START: Nitroglycerin 0.4 mg under your tongue as needed for Chest Pain  If you have chest pain place 1 tablet under your tongue, wait 5 min If chest pain continues place a 2nd tablet under your tongue, wait 5 min If chest pain continues place a 3rd tablet under your tongue, wait 5 min If chest pain continues call 911  *If you need a refill on your cardiac medications before your next appointment, please call your pharmacy*   Lab Work: NONE If you have labs (blood work) drawn today and your tests are completely normal, you will receive your results only by: MyChart Message (if you have MyChart) OR A paper copy in the mail If you have any lab test that is abnormal or we need to change your treatment, we will call you to review the results.   Testing/Procedures: Your physician has requested that you have cardiac CT. Cardiac computed tomography (CT) is a painless test that uses an x-ray machine to take clear, detailed pictures of your heart. For further information please visit [email or web address] Please follow instruction sheet as given.     Follow-Up: At [facility], you and your health needs are our priority.  As part of our continuing mission to provide you with exceptional heart care, we have created designated Provider Care Teams.  These Care Teams include your primary Cardiologist (physician) and Advanced Practice Providers (APPs -  Physician Assistants and Nurse Practitioners) who all work together to provide you with the care you need, when you need it.  We recommend signing up for the patient portal called "MyChart".  Sign up information is provided on this After Visit Summary.  MyChart is used to connect with patients for Virtual Visits (Telemedicine).  Patients are able to view lab/test results, encounter notes, upcoming appointments, etc.  Non-urgent messages can  be sent to your provider as well.   To learn more about what you can do with MyChart, go to [email or web address].    Your next appointment:   3-4 month(s)  Provider:   Christell Constant, MD  or Jari Favre, PA-C, Robin Searing, NP, Jacolyn Reedy, PA-C, Eligha Bridegroom, NP, or Tereso Newcomer, PA-C        Other Instructions  Your cardiac CT will be scheduled at one of the below locations:   [facility] [address] [place], [place] [address] [phone]  OR  [facility] [address] [place], [place] [address] [phone]  OR   [facility] [address] [place], [place] [address] [phone]  If scheduled at [facility], please arrive at the [facility] Entrance (Entrance C2) of [facility] 30 minutes prior to test start time. You can use the FREE valet parking offered at entrance C (encouraged to control the heart rate for the test)  Proceed to the [facility] Radiology Department (first floor) to check-in and test prep.  All radiology patients and guests should use entrance C2 at [facility], accessed from [facility], even though the hospital's physical address listed is [address]    If scheduled at [facility] or [facility], please arrive 15 mins early for check-in and test prep.   Please follow these instructions carefully (unless otherwise directed):  Hold all erectile dysfunction medications at least 3 days (72 hrs)  prior to test. (Ie viagra, cialis, sildenafil, tadalafil, etc) We will administer nitroglycerin during this exam.   On the Night Before the Test: Be sure to Drink plenty of water. Do not consume any caffeinated/decaffeinated beverages or chocolate 12 hours prior to your test. Do not take any antihistamines 12 hours prior to your test.   On the  Day of the Test: Drink plenty of water until 1 hour prior to the test. Do not eat any food 1 hour prior to test. You may take your regular medications prior to the test.  Take metoprolol (Lopressor) 50 mg two hours prior to test. If you take Furosemide/Hydrochlorothiazide/Spironolactone, please HOLD on the morning of the test.        After the Test: Drink plenty of water. After receiving IV contrast, you may experience a mild flushed feeling. This is normal. On occasion, you may experience a mild rash up to 24 hours after the test. This is not dangerous. If this occurs, you can take Benadryl 25 mg and increase your fluid intake. If you experience trouble breathing, this can be serious. If it is severe call 911 IMMEDIATELY. If it is mild, please call our office. If you take any of these medications: Glipizide/Metformin, Avandament, Glucavance, please do not take 48 hours after completing test unless otherwise instructed.  We will call to schedule your test 2-4 weeks out understanding that some insurance companies will need an authorization prior to the service being performed.   For non-scheduling related questions, please contact the cardiac imaging nurse navigator should you have any questions/concerns: Rockwell [place], Cardiac Imaging Nurse Navigator Larey Brick, Cardiac Imaging Nurse Navigator [facility] Direct Office Dial: [phone]   For scheduling needs, including cancellations and rescheduling, please call [place], [phone].

## 2022-11-29 ENCOUNTER — Telehealth: Payer: Self-pay | Admitting: Family Medicine

## 2022-11-29 MED ORDER — LEVOTHYROXINE SODIUM 150 MCG PO TABS: ORAL_TABLET | ORAL | 3 refills | Status: DC

## 2022-11-29 NOTE — Telephone Encounter (Signed)
Prescription Request  11/29/2022  LOV: 11/10/2022  What is the name of the medication or equipment? Levothyroxine. Pt states he lost his last bottle.   Have you contacted your pharmacy to request a refill? Yes   Which pharmacy would you like this sent to?  [id number] - [place], [place] - [address] [place] [address] Phone: [phone] Fax: [phone]    Patient notified that their request is being sent to the clinical staff for review and that they should receive a response within 2 business days.   Please advise at Mobile [phone] (mobile)

## 2022-11-29 NOTE — Telephone Encounter (Signed)
Rx sent 

## 2022-12-02 ENCOUNTER — Telehealth: Payer: Self-pay | Admitting: Family Medicine

## 2022-12-02 NOTE — Telephone Encounter (Signed)
Prescription Request  12/02/2022  LOV: 11/10/2022  What is the name of the medication or equipment? Levothyroxine. Pt states the pharmacy only filled his Nitroglycern, however he needs the Levothyroxine refilled. He states the pharmacy said they do not have the order request. Pt is out of Levothyroxine medication and need refill ASAP.   Have you contacted your pharmacy to request a refill? Yes   Which pharmacy would you like this sent to?  [id number] - [place], [place] - [address] [place] [address] Phone: [phone] Fax: [phone]    Patient notified that their request is being sent to the clinical staff for review and that they should receive a response within 2 business days.   Please advise at Mobile [phone] (mobile)

## 2022-12-03 MED ORDER — LEVOTHYROXINE SODIUM 150 MCG PO TABS: ORAL_TABLET | ORAL | 3 refills | Status: DC

## 2022-12-03 NOTE — Telephone Encounter (Signed)
Sent in on 4/22, re-sent today, 4/26.

## 2022-12-17 ENCOUNTER — Ambulatory Visit (INDEPENDENT_AMBULATORY_CARE_PROVIDER_SITE_OTHER): Payer: Medicare Other | Admitting: Family Medicine

## 2022-12-17 ENCOUNTER — Encounter: Payer: Self-pay | Admitting: Family Medicine

## 2022-12-17 VITALS — BP 100/60 | HR 79 | Temp 97.6°F | Resp 16 | Ht 63.0 in | Wt 180.1 lb

## 2022-12-17 DIAGNOSIS — I10 Essential (primary) hypertension: Secondary | ICD-10-CM

## 2022-12-17 DIAGNOSIS — R079 Chest pain, unspecified: Secondary | ICD-10-CM

## 2022-12-17 DIAGNOSIS — I83893 Varicose veins of bilateral lower extremities with other complications: Secondary | ICD-10-CM

## 2022-12-17 DIAGNOSIS — M25551 Pain in right hip: Secondary | ICD-10-CM | POA: Diagnosis not present

## 2022-12-17 DIAGNOSIS — M159 Polyosteoarthritis, unspecified: Secondary | ICD-10-CM | POA: Diagnosis not present

## 2022-12-17 DIAGNOSIS — R1031 Right lower quadrant pain: Secondary | ICD-10-CM

## 2022-12-17 MED ORDER — TRAMADOL HCL 50 MG PO TABS
25.0000 mg | ORAL_TABLET | Freq: Two times a day (BID) | ORAL | 0 refills | Status: AC | PRN
Start: 2022-12-17 — End: 2023-01-06

## 2022-12-17 NOTE — Progress Notes (Unsigned)
ACUTE VISIT Chief Complaint  Patient presents with   Leg Pain   HPI: [name] is a 77 y.o. male, who is here today complaining of recurrent right groin pain. He has had problem for years but gradually getting worse. Pain starts right under right groin, exacerbated by movement after prolonged rest and with prolonged walking. It does not interferes with sleep. It radiated to anterior aspect and distal/inner thigh. No associated back pain. Pain is interfering with daily activities. He has hx of anxiety and exercise really helps, concerned he will not be able to continue doing so.  He saw Dr Jackson,07/08/22, who recommended Prednisone treatment and 3 weeks follow up. Pain resolved after completing treatment but reoccured a few days ago.  Arthralgias: Shoulders,knees,ankles. He tried Duloxetine, did not note significant benefits, discontinued. LE achy pain, attributed to varicose vein disease. No numbness or tingling. Negative for edema or erythema.  Since his last visit he was evaluated in the ED for bilateral shoulder and upper chest pain and feeling like he could not breath. Work-up otherwise negative. Evaluated by cardiologist on 11/24/22, nitro was recommended and stress test has been scheduled. He does not feel like work up is needed, symptoms have resolved, denies any CP or palpitations.  Noted BP mildly low. He is not monitoring BP at home. Last visit recommended decreasing Amlodipine-Olmesartan 5-40 mg to 1/2 tab, he is alternating between 1 and 1/2 tab daily. Denies severe/frequent headache, visual changes,DOE, orthopnea,PND, claudication like symptoms, focal weakness. Lab Results  Component Value Date   CREATININE 0.85 11/15/2022   BUN 17 11/15/2022   NA 138 11/15/2022   K 4.3 11/15/2022   CL 102 11/15/2022   CO2 24 11/15/2022   Lab Results  Component Value Date   WBC 6.4 11/15/2022   HGB 13.7 11/15/2022   HCT 40.9 11/15/2022   MCV 91.7 11/15/2022    PLT 306 11/15/2022   Review of Systems  Constitutional:  Negative for chills and fever.  HENT:  Negative for mouth sores and sore throat.   Respiratory:  Negative for cough and wheezing.   Gastrointestinal:  Negative for abdominal pain, blood in stool, nausea and vomiting.  Genitourinary:  Negative for decreased urine volume, dysuria and hematuria.  Allergic/Immunologic: Positive for environmental allergies.  Neurological:  Negative for syncope and facial asymmetry.  Psychiatric/Behavioral:  Negative for confusion. The patient is nervous/anxious.   See other pertinent positives and negatives in HPI.  Current Outpatient Medications on File Prior to Visit  Medication Sig Dispense Refill   amLODipine-olmesartan (AZOR) 5-40 MG tablet Take 0.5 tablets by mouth daily. 90 tablet 3   budesonide-formoterol (SYMBICORT) 160-4.5 MCG/ACT inhaler Inhale 2 puffs into the lungs as needed (shortness of breath).     esomeprazole (NEXIUM) 40 MG capsule Take 40 mg by mouth as needed (heartburn).     fluticasone-salmeterol (ADVAIR) 250-50 MCG/ACT AEPB Inhale 1 puff into the lungs in the morning and at bedtime. 60 each 2   levothyroxine (SYNTHROID) 150 MCG tablet 0.5 tab Tuesdays and Thursdays, 1 tab rest of days. 90 tablet 3   metoprolol tartrate (LOPRESSOR) 50 MG tablet Take 2 hours prior to Cardiac CT 1 tablet 0   montelukast (SINGULAIR) 10 MG tablet Take 1 tablet (10 mg total) by mouth at bedtime. (Patient taking differently: Take 10 mg by mouth as needed (allergies).) 90 tablet 1   nitroGLYCERIN (NITROSTAT) 0.4 MG SL tablet Place 1 tablet (0.4 mg total) under the tongue every 5 (five) minutes as  needed for chest pain. 25 tablet 3   No current facility-administered medications on file prior to visit.   Past Medical History:  Diagnosis Date   Allergy    Arthritis    Asthma    Dyspnea    Hypertension    Hypogonadism male    Prediabetes    Thyroid disease    Hypothyroidism   Varicose veins     Allergies  Allergen Reactions   Aspirin Anaphylaxis    Angioedema,    Penicillins Anaphylaxis   Ketorolac Tromethamine     Social History   Socioeconomic History   Marital status: Married    Spouse name: Not on file   Number of children: 2   Years of education: Not on file   Highest education level: Not on file  Occupational History   Not on file  Tobacco Use   Smoking status: Never   Smokeless tobacco: Never  Vaping Use   Vaping Use: Never used  Substance and Sexual Activity   Alcohol use: Yes    Alcohol/week: 9.0 standard drinks of alcohol    Types: 5 Cans of beer, 4 Shots of liquor per week   Drug use: No   Sexual activity: Not Currently  Other Topics Concern   Not on file  Social History Narrative   Not on file   Social Determinants of Health   Financial Resource Strain: Low Risk  (05/25/2022)   Overall Financial Resource Strain (CARDIA)    Difficulty of Paying Living Expenses: Not hard at all  Food Insecurity: No Food Insecurity (05/25/2022)   Hunger Vital Sign    Worried About Running Out of Food in the Last Year: Never true    Ran Out of Food in the Last Year: Never true  Transportation Needs: No Transportation Needs (05/25/2022)   PRAPARE - Administrator, Civil Service (Medical): No    Lack of Transportation (Non-Medical): No  Physical Activity: Sufficiently Active (05/25/2022)   Exercise Vital Sign    Days of Exercise per Week: 7 days    Minutes of Exercise per Session: 90 min  Stress: No Stress Concern Present (05/25/2022)   [employer] of Occupational Health - Occupational Stress Questionnaire    Feeling of Stress : Only a little  Social Connections: Not on file   Vitals:   12/17/22 1017  BP: 100/60  Pulse: 79  Resp: 16  Temp: 97.6 F (36.4 C)  SpO2: 94%   Body mass index is 31.91 kg/m.  Physical Exam Vitals and nursing note reviewed.  Constitutional:      General: He is not in acute distress.    Appearance: He  is well-developed.  HENT:     Head: Normocephalic and atraumatic.  Eyes:     Conjunctiva/sclera: Conjunctivae normal.  Cardiovascular:     Rate and Rhythm: Normal rate and regular rhythm.     Heart sounds: No murmur heard. Pulmonary:     Effort: Pulmonary effort is normal. No respiratory distress.     Breath sounds: Normal breath sounds.  Abdominal:     Palpations: Abdomen is soft. There is no mass.     Tenderness: There is no abdominal tenderness.  Musculoskeletal:       Legs:     Comments: Inner thigh pain elicited with hip adduction and internal rotation, lateral hip pain with abduction. No pain with full flexion. No significant limitation of hip ROM.  Skin:    General: Skin is warm.  Findings: No erythema or rash.  Neurological:     Mental Status: He is alert and oriented to person, place, and time.     Comments: Antalgic gait, not assisted.  Psychiatric:        Mood and Affect: Affect normal. Mood is anxious.   ASSESSMENT AND PLAN:  Clarence Sanders was seen today for right hip pain and HTN.  Right hip pain Pain is localized on anterior and inner aspect right under right groin. Overall, he expresses significant distress related to his symptoms, which impact his daily activities and quality of life. We discussed possible etiologies: OA,radicular pain,and tendinitis among some. He has been evaluated by urologist for elevated PSA and BPH, reporting examination as normal. Pain resolved while he was on Prednisone. Recommend following with sport medicine, he would like for [place] to arrange appt, referral place. In regard to pain management, recommend Tramadol, side effects discussed.  -     Ambulatory referral to Sports Medicine -     traMADol HCl; Take 0.5-1 tablets (25-50 mg total) by mouth every 12 (twelve) hours as needed for up to 20 days.  Dispense: 15 tablet; Refill: 0  Generalized osteoarthritis of multiple sites  He took Duloxetine for a few weeks, no significant benefit  noted. For now continue Tylenol 500 mg 3-4 times as needed.  Essential (primary) hypertension BP mildly low for him. Recommend decreasing dose of Amlodipine-Olmesartan 5-20 mg to 1/2 tab daily. Continue monitoring BP but daily. Continue low salt diet.  Chest pain, unspecified type He has not had another episode. Has appt for stress test scheduled on 02/25/23. Instructed about warning signs.  Return if symptoms worsen or fail to improve.  Zuriah Bordas G. [place], MD  [facility]. [place] office.

## 2022-12-17 NOTE — Patient Instructions (Addendum)
A few things to remember from today's visit:  Varicose veins of bilateral lower extremities with other complications  Right inguinal pain - Plan: Ambulatory referral to Sports Medicine, traMADol (ULTRAM) 50 MG tablet  Tramadol 1/2 - 1 tab 2 veces al dia. Tramadol produce somnoliencia.  Para la presion arterial tome solamente 1/2 tab diaria del medicamento. Tomese la presion en la casa.  If you need refills for medications you take chronically, please call your pharmacy. Do not use My Chart to request refills or for acute issues that need immediate attention. If you send a my chart message, it may take a few days to be addressed, specially if I am not in the office.  Please be sure medication list is accurate. If a new problem present, please set up appointment sooner than planned today.

## 2022-12-20 ENCOUNTER — Ambulatory Visit: Payer: Medicare Other | Admitting: Sports Medicine

## 2022-12-20 ENCOUNTER — Other Ambulatory Visit: Payer: Self-pay

## 2022-12-20 VITALS — BP 110/68 | HR 70 | Ht 63.0 in | Wt 180.0 lb

## 2022-12-20 DIAGNOSIS — M25551 Pain in right hip: Secondary | ICD-10-CM | POA: Diagnosis not present

## 2022-12-20 DIAGNOSIS — M1611 Unilateral primary osteoarthritis, right hip: Secondary | ICD-10-CM

## 2022-12-20 DIAGNOSIS — G8929 Other chronic pain: Secondary | ICD-10-CM

## 2022-12-20 NOTE — Progress Notes (Signed)
Clarence Sanders D.[facility] [address] [place] [address] Phone: [phone]   Assessment and Plan:     1. Chronic right hip pain 2. Primary osteoarthritis of right hip - chronic with exacerbation, subsequent visit - consistent with repeat flare of severe femoroarticular osteoarthritis - prednisone dose pack only provided 1-2 weeks relief and then pain returned. Tramadol from another provided only provides mild temporary relief. Patient has allergy to NSAIDs.  - patient elected for Hip CSI. Tolerated well per note below - Start Tylenol 500 to 1000 mg tablets 2-3 times a day for day-to-day pain relief  Procedure: Ultrasound Guided Hip Acetabulofemoral Joint Injection Side: right Diagnosis: HIP OA [place] Indication:  - accuracy is paramount for diagnosis - to ensure therapeutic efficacy or procedural success - to reduce procedural risk  After explaining the procedure, viable alternatives, risks, and answering any questions, consent was given verbally. The site was cleaned with chlorhexidine prep. An ultrasound transducer was placed on the anterior thigh/hip.   The acetabular joint, labrum, and femoral shaft were identified.  The neurovascular structures were identified and an approach was found specifically avoiding these structures.  A steroid injection was performed under ultrasound guidance with sterile technique using 2ml of 1% lidocaine without epinephrine and 40 mg of triamcinolone (KENALOG) 40mg /ml. This was well tolerated and resulted in  relief.  Needle was removed and dressing placed and post injection instructions were given including  a discussion of likely return of pain today after the anesthetic wears off (with the possibility of worsened pain) until the steroid starts to work in 1-3 days.   Pt was advised to call or return to clinic if these symptoms worsen or fail to improve as anticipated.     Pertinent previous records reviewed  include none   Follow Up: 3-4 weeks. If no improvement would discuss pain management vs ortho referral      Subjective:   I, Clarence Sanders, am serving as a Neurosurgeon for Doctor Richardean Sale   Chief Complaint: low back and right hip pain    HPI:    07/08/22 Patient is a 77 year old male complaining of low back and right hip pain . Patient states that  the pain is in the groin not when he walks but after he finishes walking that's when it hurts, he has to rest for 2-3 days and its just a cycle , sometimes he takes meds for the pain , meds help when he does take them , no numbness or tingling, pain does go down the leg but the pain is deep inside the leg ,   12/17/2022 PCP  [name] is a 77 y.o. male, who is here today complaining of recurrent right groin pain. He has had problem for years but gradually getting worse. Pain starts right under right groin, exacerbated by movement after prolonged rest and with prolonged walking. It does not interferes with sleep. It radiated to anterior aspect and distal/inner thigh. No associated back pain. Pain is interfering with daily activities. He has hx of anxiety and exercise really helps, concerned he will not be able to continue doing so.   He saw Dr Cutter Passey,07/08/22, who recommended Prednisone treatment and 3 weeks follow up. Pain resolved after completing treatment but reoccured a few days ago.   Arthralgias: Shoulders,knees,ankles. He tried Duloxetine, did not note significant benefits, discontinued. LE achy pain, attributed to varicose vein disease. No numbness or tingling. Negative for edema or erythema.  Since his last visit he was evaluated in the ED for bilateral shoulder and upper chest pain and feeling like he could not breath. Work-up otherwise negative. Evaluated by cardiologist on 11/24/22, nitro was recommended and stress test has been scheduled. He does not feel like work up is needed, symptoms have resolved,  denies any CP or palpitations.   Noted BP mildly low. He is not monitoring BP at home. Last visit recommended decreasing Amlodipine-Olmesartan 5-40 mg to 1/2 tab, he is alternating between 1 and 1/2 tab daily. Denies severe/frequent headache, visual changes,DOE, orthopnea,PND, claudication like symptoms, focal weakness.  12/20/2022 Patient states he feels pain in his groin to his quad , he has intermittent pain and when he walks, prednisone didn't work that well , more pain in his groin when he is getting up from sitting , he has to sit in a higher chair with his leg extended to relieve the pain     Relevant Historical Information: Hypertension, GERD, Barrett's esophagus  Additional pertinent review of systems negative.   Current Outpatient Medications:    amLODipine-olmesartan (AZOR) 5-40 MG tablet, Take 0.5 tablets by mouth daily., Disp: 90 tablet, Rfl: 3   budesonide-formoterol (SYMBICORT) 160-4.5 MCG/ACT inhaler, Inhale 2 puffs into the lungs as needed (shortness of breath)., Disp: , Rfl:    esomeprazole (NEXIUM) 40 MG capsule, Take 40 mg by mouth as needed (heartburn)., Disp: , Rfl:    fluticasone-salmeterol (ADVAIR) 250-50 MCG/ACT AEPB, Inhale 1 puff into the lungs in the morning and at bedtime., Disp: 60 each, Rfl: 2   levothyroxine (SYNTHROID) 150 MCG tablet, 0.5 tab Tuesdays and Thursdays, 1 tab rest of days., Disp: 90 tablet, Rfl: 3   metoprolol tartrate (LOPRESSOR) 50 MG tablet, Take 2 hours prior to Cardiac CT, Disp: 1 tablet, Rfl: 0   montelukast (SINGULAIR) 10 MG tablet, Take 1 tablet (10 mg total) by mouth at bedtime. (Patient taking differently: Take 10 mg by mouth as needed (allergies).), Disp: 90 tablet, Rfl: 1   nitroGLYCERIN (NITROSTAT) 0.4 MG SL tablet, Place 1 tablet (0.4 mg total) under the tongue every 5 (five) minutes as needed for chest pain., Disp: 25 tablet, Rfl: 3   traMADol (ULTRAM) 50 MG tablet, Take 0.5-1 tablets (25-50 mg total) by mouth every 12 (twelve)  hours as needed for up to 20 days., Disp: 15 tablet, Rfl: 0   Objective:     Vitals:   12/20/22 1047  BP: 110/68  Pulse: 70  SpO2: 98%  Weight: 180 lb (81.6 kg)  Height: 5\' 3"  (1.6 m)      Body mass index is 31.89 kg/m.    Physical Exam:    General: awake, alert, and oriented no acute distress, nontoxic Skin: no suspicious lesions or rashes Neuro:sensation intact distally with no dificits, normal muscle tone, no atrophy, strength 5/5 in all tested lower ext groups Psych: normal mood and affect, speech clear   Right hip: No deformity, swelling or wasting ROM Flexion 80, ext 20, IR 30, ER 35 NTTP over the hip flexors, greater trochanter, gluteal musculature, si joint, lumbar spine Negative log roll with FROM   Positive FADIR   Gait normal    Electronically signed by:  Clarence Sanders D.[facility] 11:10 AM 12/20/22

## 2022-12-20 NOTE — Patient Instructions (Addendum)
Good to see you  Tylenol [phone] mg 2-3 times a day for pain relief  3-4 week follow up  Can start walking again on Wednesday

## 2022-12-23 ENCOUNTER — Telehealth: Payer: Self-pay | Admitting: Family Medicine

## 2022-12-23 DIAGNOSIS — J4520 Mild intermittent asthma, uncomplicated: Secondary | ICD-10-CM

## 2022-12-23 NOTE — Telephone Encounter (Signed)
Prescription Request  12/23/2022  LOV: 12/17/2022  What is the name of the medication or equipment? Fluticasone Propionate and Salmeterol 267mcg/50mcg. Have you contacted your pharmacy to request a refill? Yes   Which pharmacy would you like this sent to?  [facility] - [place], [place] - [address] [place] [address] Phone: [phone] Fax: [phone]    Patient notified that their request is being sent to the clinical staff for review and that they should receive a response within 2 business days.   Please advise at Mobile [phone] (mobile)

## 2022-12-24 MED ORDER — FLUTICASONE-SALMETEROL 250-50 MCG/ACT IN AEPB: 1.0000 | INHALATION_SPRAY | Freq: Two times a day (BID) | RESPIRATORY_TRACT | 2 refills | Status: DC

## 2022-12-24 NOTE — Telephone Encounter (Signed)
Rx sent 

## 2023-01-05 ENCOUNTER — Telehealth (HOSPITAL_COMMUNITY): Payer: Self-pay | Admitting: *Deleted

## 2023-01-05 ENCOUNTER — Other Ambulatory Visit (HOSPITAL_COMMUNITY): Payer: Self-pay | Admitting: *Deleted

## 2023-01-05 MED ORDER — METOPROLOL TARTRATE 50 MG PO TABS: ORAL_TABLET | ORAL | 0 refills | Status: AC

## 2023-01-05 NOTE — Telephone Encounter (Signed)
Attempted to call patient (Via Spanish interpreter, [place] ID# [phone]) regarding upcoming cardiac CT appointment. Left message on voicemail with name and callback number  Larey Brick RN Navigator Cardiac Imaging [facility] [phone] Office [phone] Cell

## 2023-01-05 NOTE — Telephone Encounter (Signed)
Reaching out to patient (via Spanish interpreter: Jearld Adjutant ID: [phone]) to offer assistance regarding upcoming cardiac imaging study; pt verbalizes understanding of appt date/time, parking situation and where to check in, pre-test NPO status and medications ordered, and verified current allergies; name and call back number provided for further questions should they arise  Larey Brick RN Navigator Cardiac Imaging [facility] [phone] office [phone] cell  Patient to take 50mg  metoprolol tartrate two hours prior to his cardiac CT scan.  He is aware to arrive at 8:30am.

## 2023-01-06 ENCOUNTER — Ambulatory Visit (HOSPITAL_COMMUNITY)
Admission: RE | Admit: 2023-01-06 | Discharge: 2023-01-06 | Disposition: A | Discharge status: Home / Self Care | Payer: Medicare Other | Source: Ambulatory Visit | Attending: Internal Medicine | Admitting: Internal Medicine

## 2023-01-06 DIAGNOSIS — R072 Precordial pain: Secondary | ICD-10-CM | POA: Diagnosis not present

## 2023-01-06 MED ORDER — NITROGLYCERIN 0.4 MG SL SUBL
SUBLINGUAL_TABLET | SUBLINGUAL | Status: AC
  Filled 2023-01-06: qty 2

## 2023-01-06 MED ORDER — NITROGLYCERIN 0.4 MG SL SUBL
0.8000 mg | SUBLINGUAL_TABLET | Freq: Once | SUBLINGUAL | Status: AC
  Administered 2023-01-06: 0.8 mg via SUBLINGUAL

## 2023-01-06 MED ORDER — IOHEXOL 350 MG/ML SOLN
95.0000 mL | Freq: Once | INTRAVENOUS | Status: AC | PRN
  Administered 2023-01-06: 95 mL via INTRAVENOUS

## 2023-01-07 ENCOUNTER — Telehealth: Payer: Self-pay

## 2023-01-07 DIAGNOSIS — E782 Mixed hyperlipidemia: Secondary | ICD-10-CM

## 2023-01-07 DIAGNOSIS — I700 Atherosclerosis of aorta: Secondary | ICD-10-CM

## 2023-01-07 MED ORDER — ATORVASTATIN CALCIUM 40 MG PO TABS: 40.0000 mg | ORAL_TABLET | Freq: Every day | ORAL | 3 refills | Status: AC

## 2023-01-07 NOTE — Telephone Encounter (Signed)
The patient has been notified of the result and verbalized understanding.  All questions (if any) were answered. Macie Burows, RN 01/07/2023 11:49 AM   Used interpreter #[id number].   Pt reports allergy to aspirin and will not take as recommended.  Will send to MD to advise  FLP, ALT on 05/02/23

## 2023-01-07 NOTE — Telephone Encounter (Signed)
-----   Message from Christell Constant, MD sent at 01/06/2023  2:03 PM EDT ----- Results: Non obstructive CAD Plan: Start ASA 81 mg PO daily Restart atorvastatin 40 mg PO daily Has f/u in 02/2023  Christell Constant, MD

## 2023-01-10 ENCOUNTER — Ambulatory Visit: Payer: Medicare Other | Admitting: Family Medicine

## 2023-01-10 MED ORDER — PANTOPRAZOLE SODIUM 40 MG PO TBEC: 40.0000 mg | DELAYED_RELEASE_TABLET | Freq: Every day | ORAL | 11 refills | Status: AC | PRN

## 2023-01-10 MED ORDER — CLOPIDOGREL BISULFATE 75 MG PO TABS: 75.0000 mg | ORAL_TABLET | Freq: Every day | ORAL | 3 refills | Status: AC

## 2023-01-10 NOTE — Telephone Encounter (Signed)
Called pt advised of MD recommendation( interpreter [phone]): Plavix 75 mg PO daily instead   Pt is agreeable to plan.  Reviewed bleeding/ bruising precautions with pt. Pt had no questions or concerns. Ordered Plavix received an alert that med is less efficient with use of omperazole. Verbally spoke with pharmacist Aundra Millet who advises pt stop esomperazole and start pantoprazole 40 mg.   Called pt advised of the above findings.  Pt is agreeable to medication change.  Order for pantoprazole 40 mg PO QD PRN acid reflux sent to pharmacy on file.  All questions answered.  Message routed to PCP as an update to medication list.

## 2023-01-10 NOTE — Telephone Encounter (Signed)
Addendum: called pt using interpreter [phone].

## 2023-01-11 ENCOUNTER — Telehealth: Payer: Self-pay | Admitting: Internal Medicine

## 2023-01-11 NOTE — Telephone Encounter (Signed)
Pt daughter called to get info on who spoke to her Father and where he was seen for the cardiologist appt because he was having a difficult time understanding what was being said when the nurse called.   Provided pt daughter with number to the [facility] facility where he was seen.   FYI.

## 2023-01-11 NOTE — Telephone Encounter (Signed)
Pt daughter calling back to speak about results

## 2023-01-11 NOTE — Telephone Encounter (Signed)
Please see encounter from today for more information.

## 2023-01-11 NOTE — Telephone Encounter (Signed)
Patient's daughter called and said that it is very important that she speaks with Shamea about what was said to her father this morning. Wants a call back asap

## 2023-01-11 NOTE — Telephone Encounter (Signed)
Called pt daughter ok per DPR.  Advised of results of Cardiac CT: Results: Non obstructive CAD Plan: Start ASA 81 mg PO daily Restart atorvastatin 40 mg PO daily Has f/u in 02/2023   Christell Constant, MD  Also advised that pt has an allergy to ASA so MD ordered clopidogrel 75 mg PO QD.  Pt can not take clopidogrel and Nexium.  Changed Nexium to Pantoprazole 40 mg PO QD PRN.   Daughter expresses understanding thank me for call back.

## 2023-01-12 DIAGNOSIS — G4733 Obstructive sleep apnea (adult) (pediatric): Secondary | ICD-10-CM | POA: Diagnosis not present

## 2023-02-11 DIAGNOSIS — H3589 Other specified retinal disorders: Secondary | ICD-10-CM | POA: Diagnosis not present

## 2023-02-13 IMAGING — DX DG FOOT COMPLETE 3+V*R*
3 series · 3 of 3 positions shown · non-contrast
Comparison: None Available.

CLINICAL DATA: Right first metatarsal pain.

EXAM:
RIGHT FOOT COMPLETE - 3+ VIEW

[foot ap]
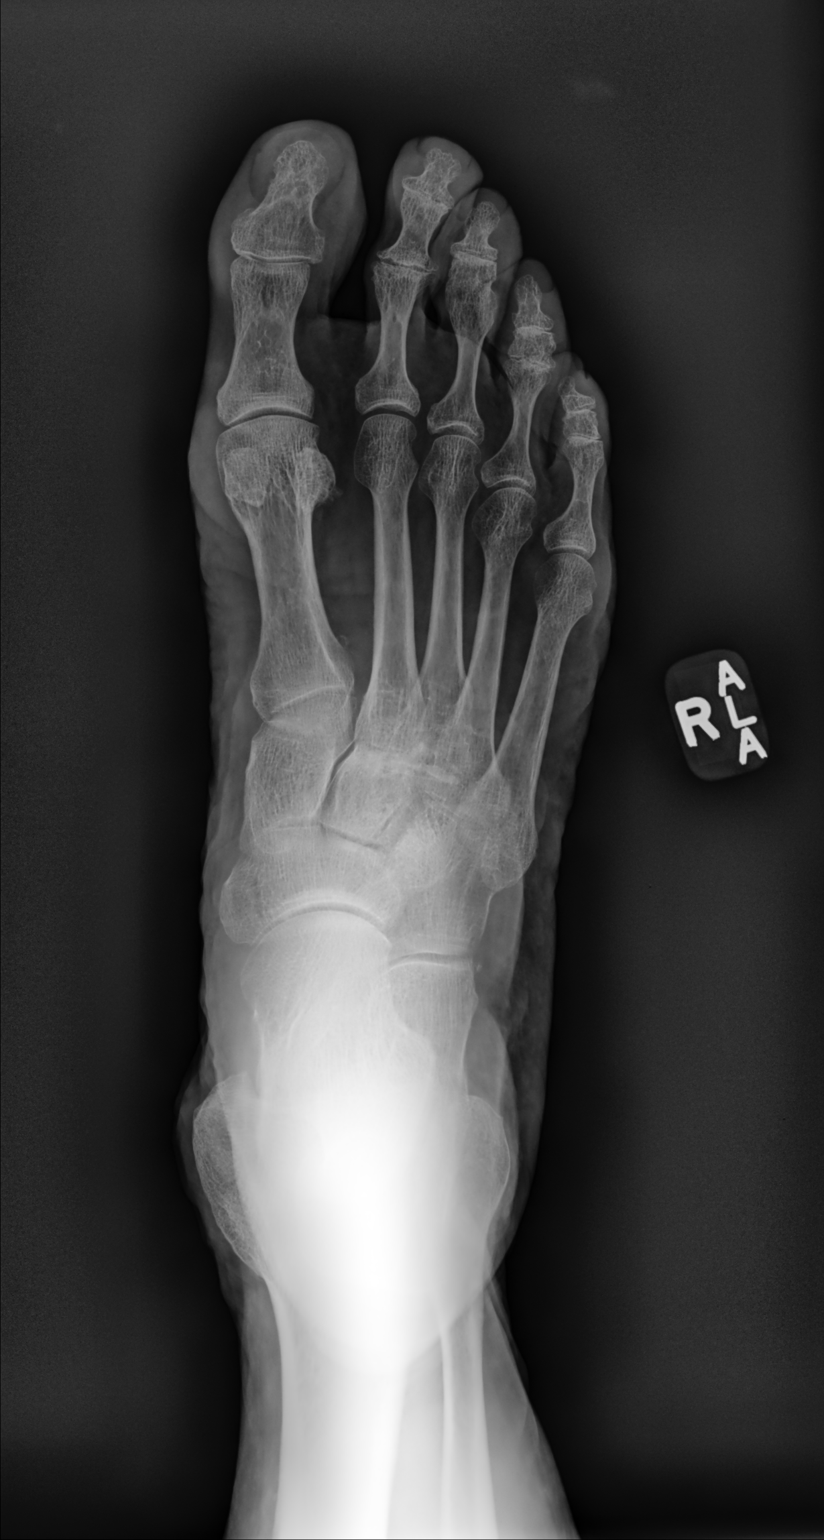

[foot mlo]
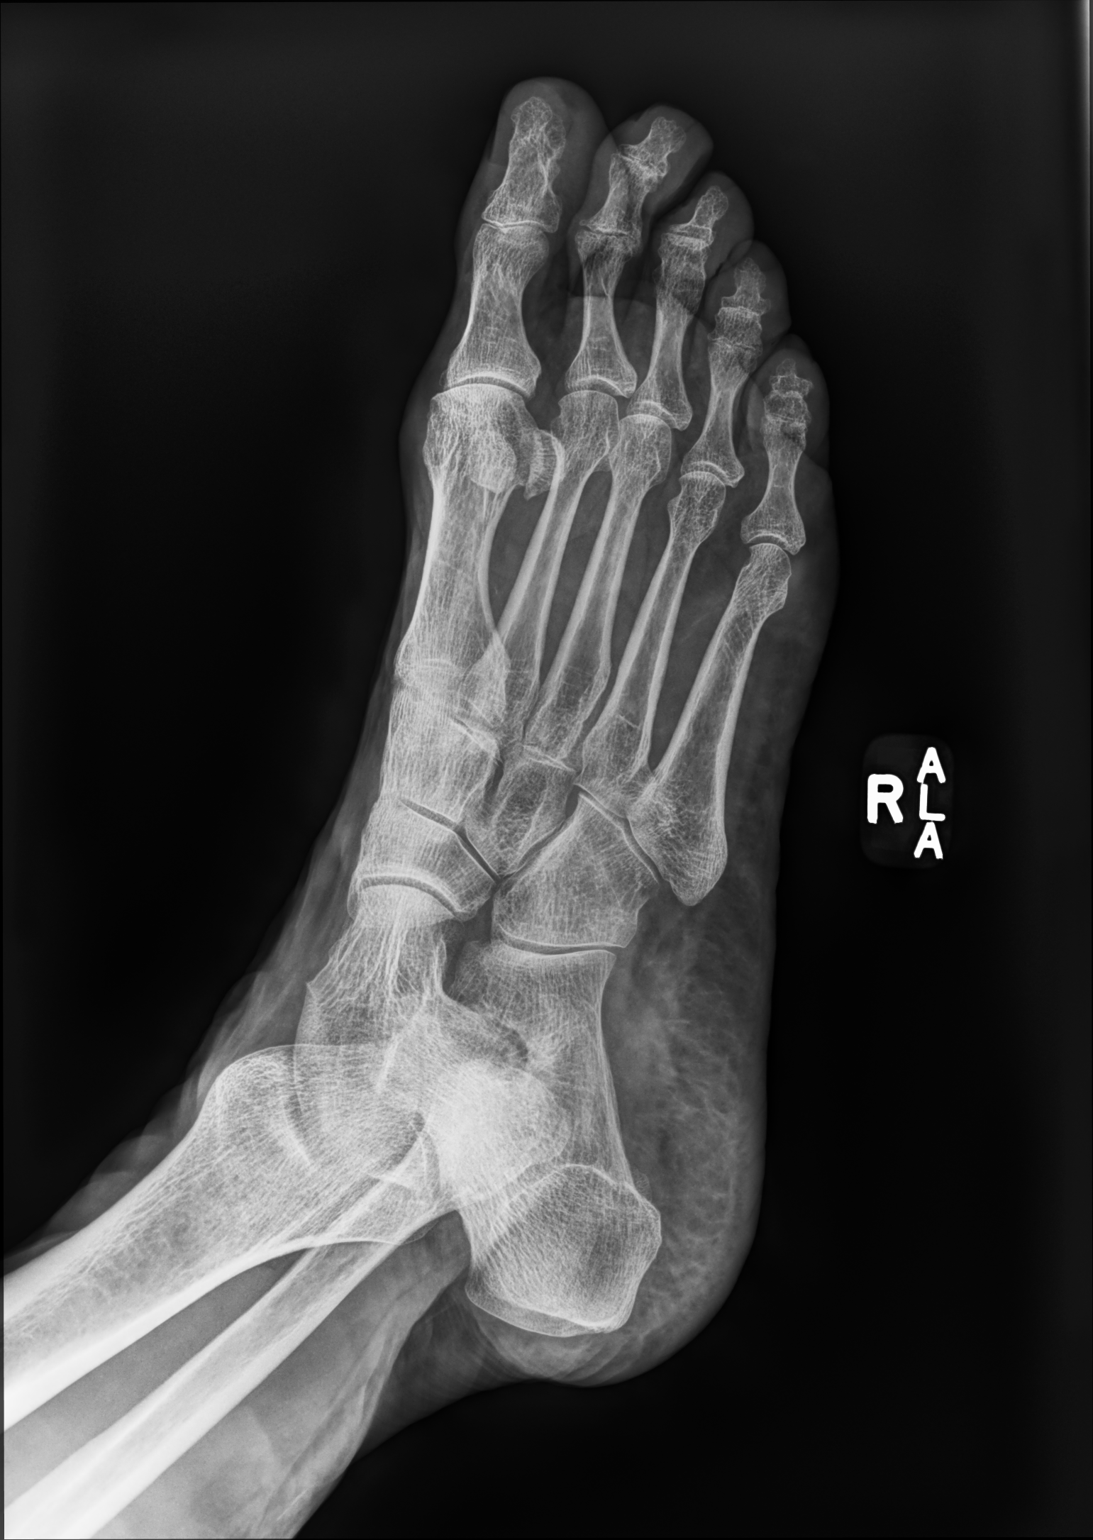

[foot lat]
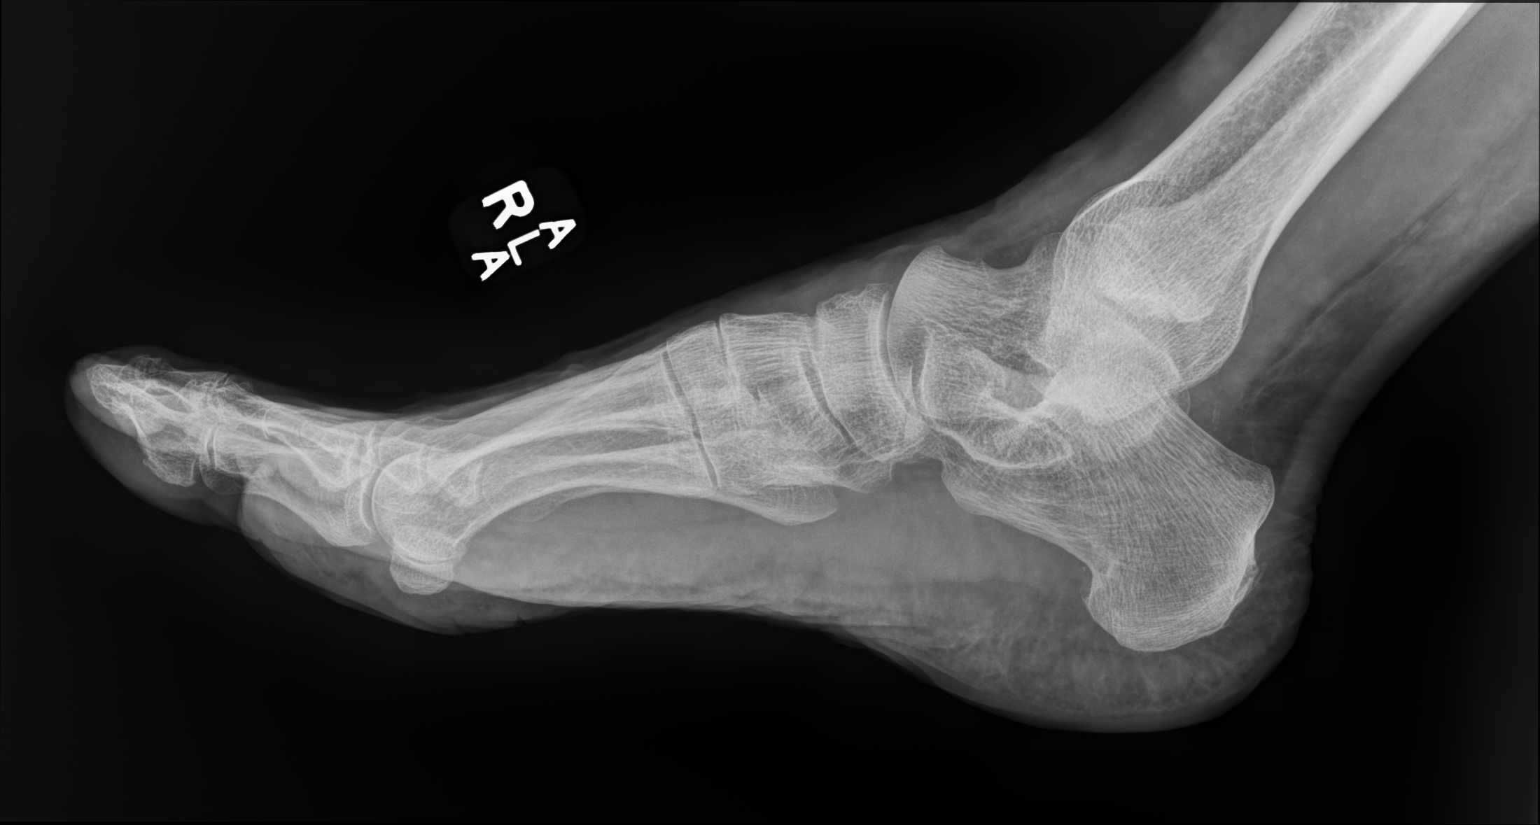

[3 of 3 positions shown; findings below may reference images not displayed]

FINDINGS: There is linear lucency through the medial aspect of the navicular
bone favored is artifact or normal variant. There is no overlying
soft tissue swelling. There is mild degenerative narrowing of the
first metatarsophalangeal joint. There also mild degenerative
changes of the second, third and fourth proximal interphalangeal
joints with joint space narrowing and osteophyte formation.

Soft tissues are unremarkable.
IMPRESSION: 1. No acute bony abnormality.
2. Mild degenerative changes of the foot as above.

## 2023-02-21 ENCOUNTER — Telehealth: Payer: Self-pay | Admitting: Family Medicine

## 2023-02-21 DIAGNOSIS — J4520 Mild intermittent asthma, uncomplicated: Secondary | ICD-10-CM

## 2023-02-21 DIAGNOSIS — I10 Essential (primary) hypertension: Secondary | ICD-10-CM

## 2023-02-21 DIAGNOSIS — J301 Allergic rhinitis due to pollen: Secondary | ICD-10-CM

## 2023-02-21 MED ORDER — FLUTICASONE-SALMETEROL 250-50 MCG/ACT IN AEPB
1.0000 | INHALATION_SPRAY | Freq: Two times a day (BID) | RESPIRATORY_TRACT | 0 refills | Status: DC
Start: 2023-02-21 — End: 2023-02-25

## 2023-02-21 MED ORDER — AMLODIPINE-OLMESARTAN 5-40 MG PO TABS
0.5000 | ORAL_TABLET | Freq: Every day | ORAL | 0 refills | Status: DC
Start: 2023-02-21 — End: 2023-02-25

## 2023-02-21 MED ORDER — LEVOTHYROXINE SODIUM 150 MCG PO TABS: ORAL_TABLET | ORAL | 0 refills | Status: DC

## 2023-02-21 MED ORDER — MONTELUKAST SODIUM 10 MG PO TABS
10.0000 mg | ORAL_TABLET | Freq: Every day | ORAL | 0 refills | Status: DC
Start: 2023-02-21 — End: 2023-02-25

## 2023-02-21 NOTE — Telephone Encounter (Signed)
Rx's sent, patient's daughter aware.

## 2023-02-21 NOTE — Telephone Encounter (Signed)
Pt daughter is calling and pt will be moving to [place] on 03-10-2023 and does have a new pt appt with new md on 03-31-2023 and would like refills on these medications amLODipine-olmesartan (AZOR) 5-40 MG tablet fluticasone-salmeterol (ADVAIR) 250-50 MCG/ACT AEPB ,levothyroxine (SYNTHROID) 150 MCG tablet , montelukast (SINGULAIR) 10 MG tablet ,   [facility] [address] [place] - [address] Phone: [phone]  Fax: [phone]  Please call daughter once rxs has been sent

## 2023-02-23 ENCOUNTER — Encounter: Payer: Self-pay | Admitting: Physician Assistant

## 2023-02-23 DIAGNOSIS — I2510 Atherosclerotic heart disease of native coronary artery without angina pectoris: Secondary | ICD-10-CM

## 2023-02-23 HISTORY — DX: Atherosclerotic heart disease of native coronary artery without angina pectoris: I25.10

## 2023-02-23 NOTE — Progress Notes (Deleted)
  Cardiology Office Note:    Date:  02/23/2023  ID:  Clarence Sanders, [place] [birth year], MRN [id number] PCP: [place], Betty G, MD  [facility] Providers Cardiologist:  Christell Constant, MD { Click to update primary MD,subspecialty MD or APP then REFRESH:1}    {Click to Open Review  :1}   Patient Profile:      Coronary artery disease  CCTA 01/06/2023: Nonobstructive CAD, RCA mid < 24, LAD proximal 25-49, mid LAD minimal focal plaque; CAC score 144 (38 percentile) Hypertension  Hyperlipidemia  Pre-diabetes  Hypothyroidism  Asthma       {   Eval was [facility] April 2024 for chest pain coronary CTA with nonobstructive CAD patient placed on aspirin and statin therapy   :1}     Discussed the use of AI scribe software for clinical note transcription with the patient, who gave verbal consent to proceed.  History of Present Illness          ROS: ***    Studies Reviewed:       *** Risk Assessment/Calculations:   {Does this patient have ATRIAL FIBRILLATION?:[phone]} No BP recorded.  {Refresh Note OR Click here to enter BP  :1}***       Physical Exam:   VS:  There were no vitals taken for this visit.   Wt Readings from Last 3 Encounters:  12/20/22 180 lb (81.6 kg)  12/17/22 180 lb 2 oz (81.7 kg)  11/24/22 179 lb (81.2 kg)    GEN: Well nourished, well developed in no acute distress*** NECK: No JVD CARDIAC: ***RRR, no murmurs, rubs, gallops RESPIRATORY:  Clear to auscultation without rales, wheezing or rhonchi  ABDOMEN: Soft EXTREMITIES:  No edema     Assessment and Plan:  No problem-specific Assessment & Plan notes found for this encounter. Assessment and Plan            {      :1}    {Are you ordering a CV Procedure (e.g. stress test, cath, DCCV, TEE, etc)?   Press F2        :[id number]}  Dispo:  No follow-ups on file.  Signed, Tereso Newcomer, PA-C

## 2023-02-24 ENCOUNTER — Telehealth: Payer: Self-pay | Admitting: Family Medicine

## 2023-02-24 DIAGNOSIS — J301 Allergic rhinitis due to pollen: Secondary | ICD-10-CM

## 2023-02-24 DIAGNOSIS — J4520 Mild intermittent asthma, uncomplicated: Secondary | ICD-10-CM

## 2023-02-24 DIAGNOSIS — I10 Essential (primary) hypertension: Secondary | ICD-10-CM

## 2023-02-24 NOTE — Telephone Encounter (Signed)
Please call pt's daughter to let her know once this has been done.

## 2023-02-24 NOTE — Telephone Encounter (Signed)
Pt found out their insurance doesn't cover [facility], so they need all of their prescriptions transferred [facility].  These were transferred last week but need to be transferred again.  Pharmacy- [facility]- [address], [place] [address]                    [phone]

## 2023-02-25 ENCOUNTER — Ambulatory Visit: Payer: Medicare Other | Attending: Physician Assistant | Admitting: Physician Assistant

## 2023-02-25 ENCOUNTER — Other Ambulatory Visit: Payer: Self-pay | Admitting: Family Medicine

## 2023-02-25 ENCOUNTER — Encounter: Payer: Self-pay | Admitting: Physician Assistant

## 2023-02-25 DIAGNOSIS — I2510 Atherosclerotic heart disease of native coronary artery without angina pectoris: Secondary | ICD-10-CM

## 2023-02-25 MED ORDER — MONTELUKAST SODIUM 10 MG PO TABS
10.0000 mg | ORAL_TABLET | Freq: Every day | ORAL | 0 refills | Status: AC
Start: 2023-02-25 — End: ?

## 2023-02-25 MED ORDER — AMLODIPINE-OLMESARTAN 5-40 MG PO TABS
0.5000 | ORAL_TABLET | Freq: Every day | ORAL | 0 refills | Status: AC
Start: 2023-02-25 — End: ?

## 2023-02-25 MED ORDER — FLUTICASONE-SALMETEROL 250-50 MCG/ACT IN AEPB
1.0000 | INHALATION_SPRAY | Freq: Two times a day (BID) | RESPIRATORY_TRACT | 0 refills | Status: AC
Start: 2023-02-25 — End: ?

## 2023-02-25 MED ORDER — LEVOTHYROXINE SODIUM 150 MCG PO TABS: ORAL_TABLET | ORAL | 0 refills | Status: DC

## 2023-02-25 NOTE — Telephone Encounter (Signed)
Rx's sent, pt's daughter aware.

## 2023-03-01 ENCOUNTER — Telehealth: Payer: Self-pay | Admitting: Urology

## 2023-03-01 ENCOUNTER — Other Ambulatory Visit: Payer: Self-pay | Admitting: Family Medicine

## 2023-03-01 ENCOUNTER — Other Ambulatory Visit: Payer: Self-pay | Admitting: Urology

## 2023-03-01 DIAGNOSIS — I10 Essential (primary) hypertension: Secondary | ICD-10-CM

## 2023-03-01 MED ORDER — ALFUZOSIN HCL ER 10 MG PO TB24: 10.0000 mg | ORAL_TABLET | Freq: Every day | ORAL | 1 refills | Status: AC

## 2023-03-01 NOTE — Telephone Encounter (Signed)
Alfuzosin 10 mg Patient daughter gave [place] wrong medication name earlier. Needs this med refilled until 08/22 not the other.

## 2023-03-01 NOTE — Telephone Encounter (Signed)
Pt moved to [place]. Has an appt with new dr on 08/22. Wants a refill on meds until then at new pharmacy.  [facility] [address] [place], [place], [place] [address] [phone]  Call back #  [phone]

## 2023-03-07 ENCOUNTER — Telehealth: Payer: Self-pay | Admitting: Internal Medicine

## 2023-03-07 NOTE — Telephone Encounter (Signed)
  The patient called to inform Dr. Izora Ribas that he is currently staying in [place], which is why he missed his follow-up appointment on February 21, 2023. He plans to remain there for a while and will contact the clinic again once he returns to [place].

## 2023-03-07 NOTE — Telephone Encounter (Signed)
Cancelled 05/02/23 lab appointment.  Pt currently resides in [place]. Called pt using interpreter [phone].  Left a message to call our office.  Would like to ensure pt has f/u FLP and ALT blood draw.

## 2023-03-23 NOTE — Telephone Encounter (Signed)
Left a message to call back with assistance of  interpreter [phone].

## 2023-03-31 ENCOUNTER — Ambulatory Visit: Payer: Medicare Other | Admitting: Urology

## 2023-04-06 NOTE — Telephone Encounter (Signed)
Pt has not responded to messages will sign this encounter.

## 2023-05-02 ENCOUNTER — Other Ambulatory Visit: Payer: Medicare Other

## 2023-05-27 ENCOUNTER — Other Ambulatory Visit: Payer: Self-pay | Admitting: Family Medicine

## 2023-05-30 ENCOUNTER — Telehealth: Payer: Self-pay | Admitting: Family Medicine

## 2023-05-30 NOTE — Telephone Encounter (Signed)
Spoke with patient to schedule AWV    patient stated he has moved to [facility] and has new pcp
